# Patient Record
Sex: Female | Born: 1950 | Race: White | Hispanic: No | Marital: Married | Smoking: Former smoker
Health system: Southern US, Community
[De-identification: ages and names within clinical notes are randomized; demographics above are authoritative.]

## PROBLEM LIST (undated history)

## (undated) DIAGNOSIS — R002 Palpitations: Secondary | ICD-10-CM

## (undated) DIAGNOSIS — I456 Pre-excitation syndrome: Secondary | ICD-10-CM

## (undated) DIAGNOSIS — I6529 Occlusion and stenosis of unspecified carotid artery: Secondary | ICD-10-CM

## (undated) DIAGNOSIS — S62102A Fracture of unspecified carpal bone, left wrist, initial encounter for closed fracture: Secondary | ICD-10-CM

## (undated) DIAGNOSIS — I951 Orthostatic hypotension: Secondary | ICD-10-CM

## (undated) DIAGNOSIS — Z87898 Personal history of other specified conditions: Secondary | ICD-10-CM

## (undated) DIAGNOSIS — I6523 Occlusion and stenosis of bilateral carotid arteries: Secondary | ICD-10-CM

## (undated) DIAGNOSIS — R9431 Abnormal electrocardiogram [ECG] [EKG]: Secondary | ICD-10-CM

## (undated) DIAGNOSIS — R079 Chest pain, unspecified: Secondary | ICD-10-CM

## (undated) DIAGNOSIS — E7801 Familial hypercholesterolemia: Secondary | ICD-10-CM

## (undated) DIAGNOSIS — I10 Essential (primary) hypertension: Secondary | ICD-10-CM

## (undated) DIAGNOSIS — R001 Bradycardia, unspecified: Secondary | ICD-10-CM

## (undated) DIAGNOSIS — F419 Anxiety disorder, unspecified: Secondary | ICD-10-CM

## (undated) DIAGNOSIS — I471 Supraventricular tachycardia, unspecified: Secondary | ICD-10-CM

## (undated) DIAGNOSIS — T7840XA Allergy, unspecified, initial encounter: Secondary | ICD-10-CM

## (undated) HISTORY — PX: FRACTURE SURGERY: SHX138

## (undated) HISTORY — DX: Anxiety disorder, unspecified: F41.9

## (undated) HISTORY — DX: Abnormal electrocardiogram (ECG) (EKG): R94.31

## (undated) HISTORY — DX: Essential (primary) hypertension: I10

## (undated) HISTORY — PX: BREAST SURGERY: SHX581

## (undated) HISTORY — PX: MOLE REMOVAL: SHX2046

## (undated) HISTORY — PX: ABDOMINAL HYSTERECTOMY: SHX81

## (undated) HISTORY — DX: Pre-excitation syndrome: I45.6

## (undated) HISTORY — DX: Supraventricular tachycardia: I47.1

## (undated) HISTORY — DX: Palpitations: R00.2

## (undated) HISTORY — DX: Fracture of unspecified carpal bone, left wrist, initial encounter for closed fracture: S62.102A

## (undated) HISTORY — DX: Familial hypercholesterolemia: E78.01

## (undated) HISTORY — DX: Chest pain, unspecified: R07.9

## (undated) HISTORY — PX: BREAST BIOPSY: SHX20

## (undated) HISTORY — DX: Orthostatic hypotension: I95.1

## (undated) HISTORY — DX: Bradycardia, unspecified: R00.1

## (undated) HISTORY — PX: OTHER SURGICAL HISTORY: SHX169

## (undated) HISTORY — PX: BASAL CELL CARCINOMA EXCISION: SHX1214

## (undated) HISTORY — DX: Occlusion and stenosis of bilateral carotid arteries: I65.23

## (undated) HISTORY — DX: Personal history of other specified conditions: Z87.898

## (undated) HISTORY — DX: Allergy, unspecified, initial encounter: T78.40XA

## (undated) HISTORY — DX: Supraventricular tachycardia, unspecified: I47.10

---
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
## 1998-12-12 HISTORY — PX: ABLATION: SHX5711

## 2017-03-14 LAB — HM MAMMOGRAPHY

## 2017-12-29 ENCOUNTER — Encounter: Payer: Self-pay | Admitting: Family Medicine

## 2017-12-29 DIAGNOSIS — Z86006 Personal history of melanoma in-situ: Secondary | ICD-10-CM | POA: Insufficient documentation

## 2017-12-29 DIAGNOSIS — C439 Malignant melanoma of skin, unspecified: Secondary | ICD-10-CM | POA: Insufficient documentation

## 2017-12-29 DIAGNOSIS — E785 Hyperlipidemia, unspecified: Secondary | ICD-10-CM | POA: Insufficient documentation

## 2018-01-01 ENCOUNTER — Telehealth: Payer: Self-pay | Admitting: Family Medicine

## 2018-01-01 NOTE — Telephone Encounter (Signed)
Copied from [place]. Topic: Quick Communication - See Telephone Encounter >> Jan 01, 2018  2:28 PM Cleaster Corin, [place] wrote: CRM for notification. See Telephone encounter for:   01/01/18. Pt. Calling to see if records have been sent over to office. Pt. Can be reached at [phone] (new pt. Amber Nunez)

## 2018-01-02 NOTE — Telephone Encounter (Signed)
Patient calling again, please advise.

## 2018-01-02 NOTE — Telephone Encounter (Signed)
Spoke with patient, informed her that I would keep a look out for them and if they didn't come I would let her know.

## 2018-01-05 ENCOUNTER — Ambulatory Visit: Payer: BLUE CROSS/BLUE SHIELD | Admitting: Family Medicine

## 2018-01-05 ENCOUNTER — Encounter: Payer: Self-pay | Admitting: Family Medicine

## 2018-01-05 VITALS — BP 122/84 | HR 57 | Temp 97.9°F | Resp 12 | Ht 65.0 in | Wt 132.0 lb

## 2018-01-05 DIAGNOSIS — R001 Bradycardia, unspecified: Secondary | ICD-10-CM

## 2018-01-05 DIAGNOSIS — Z0000 Encounter for general adult medical examination without abnormal findings: Secondary | ICD-10-CM | POA: Diagnosis not present

## 2018-01-05 DIAGNOSIS — I456 Pre-excitation syndrome: Secondary | ICD-10-CM | POA: Insufficient documentation

## 2018-01-05 DIAGNOSIS — M792 Neuralgia and neuritis, unspecified: Secondary | ICD-10-CM

## 2018-01-05 DIAGNOSIS — E7800 Pure hypercholesterolemia, unspecified: Secondary | ICD-10-CM

## 2018-01-05 MED ORDER — PREDNISONE 20 MG PO TABS: ORAL_TABLET | ORAL | 0 refills | Status: AC

## 2018-01-05 NOTE — Progress Notes (Signed)
HPI:   Amber Nunez is a 67 y.o. female, who is here today to establish care.  Former PCP: N/A Last preventive routine visit: 02/2017 She recently moved from [place], 10/2017.  Chronic medical problems: HLD, WPW, anddermatitis among some.  She has follow with dermatologist in the past for skin lesions on face,upper extremities, and occasionally on abdomen. According to patient,Cclobetasol was recommended for pruritic rash on areas different of her face. Tacrolimus 0.03% was recommended for lesions on her face.  She is not sure about diagnosis, so far topical treatment has helped, she uses as needed.  Concerns today: Lab work and referral.  HLD: She has been on Pravastatin since 05/2017.  She did not tolerate another statin. Last lipid panel done in 02/2017. So far she has tolerated medication well, no side effects. She is also trying to follow a low fat diet.  WPW: Diagnosed in 2000, she has been following with cardiologist. Currently she is on Atenolol 25 mg daily. Today mild bradycardia noted, she is reporting heart rates at home in the upper 40s and mid 50s. She denies dizziness, palpitation, chest pain, or dyspnea.  She has not established with cardiologist yet.   She is also reporting history of cervical DDD, "collapsed disc." According to patient, she had a cervical MRI that about 2 years ago. About 3-4 weeks ago she started similar symptoms she did when she was first diagnosed: Burning sensation on elbow, shoulder, and left foot.  Associated with decreased sensation in same areas. She denies weakness or cervical pain. "Annoyed" burning sensation that sometimes wakes her up. Symptoms intermittently. She has not identified exacerbating or alleviating factors. She has not taking any OTC medication.  She did PT2 years ago, which help, she started exercises recently at home.   Review of Systems  Constitutional: Negative for activity change, appetite  change, fatigue and fever.  HENT: Negative for mouth sores, nosebleeds and trouble swallowing.   Eyes: Negative for redness and visual disturbance.  Respiratory: Negative for cough, shortness of breath and wheezing.   Cardiovascular: Negative for chest pain, palpitations and leg swelling.  Gastrointestinal: Negative for abdominal pain, nausea and vomiting.       Negative for changes in bowel habits.  Endocrine: Negative for cold intolerance and heat intolerance.  Genitourinary: Negative for decreased urine volume and hematuria.  Musculoskeletal: Negative for gait problem, myalgias and neck pain.  Skin: Negative for color change and rash.  Neurological: Negative for syncope, weakness and headaches.  Psychiatric/Behavioral: Positive for sleep disturbance. Negative for confusion. The patient is not nervous/anxious.       Current Outpatient Medications on File Prior to Visit  Medication Sig Dispense Refill  . aspirin 81 MG tablet Take 81 mg by mouth daily.    Marland Kitchen atenolol (TENORMIN) 25 MG tablet     . Biotin 1000 MCG tablet Take 1,000 mcg by mouth daily.    . Calcium Carb-Cholecalciferol (CALCIUM 500+D3 PO) Take by mouth.    . clobetasol cream (TEMOVATE) 0.05 %     . Multiple Vitamin (MULTIVITAMIN WITH MINERALS) TABS tablet Take 1 tablet by mouth daily.    . multivitamin-lutein (OCUVITE-LUTEIN) CAPS capsule Take 1 capsule by mouth daily.    . pravastatin (PRAVACHOL) 10 MG tablet     . tacrolimus (PROTOPIC) 0.03 % ointment Apply topically as needed.    . vitamin C (ASCORBIC ACID) 500 MG tablet Take 500 mg by mouth daily.     No current facility-administered  medications on file prior to visit.      History reviewed. No pertinent past medical history. Allergies  Allergen Reactions  . Penicillins Nausea And Vomiting  . Lortab [Hydrocodone-Acetaminophen]     Family History  Problem Relation Age of Onset  . Heart disease Mother   . Heart disease Father   . Hyperlipidemia Father   .  Heart attack Father   . Early death Father   . Cancer Father   . Hyperlipidemia Brother     Social History   Socioeconomic History  . Marital status: Unknown    Spouse name: None  . Number of children: None  . Years of education: None  . Highest education level: None  Social Needs  . Financial resource strain: None  . Food insecurity - worry: None  . Food insecurity - inability: None  . Transportation needs - medical: None  . Transportation needs - non-medical: None  Occupational History  . None  Tobacco Use  . Smoking status: Former Smoker    Types: Cigarettes  . Smokeless tobacco: Never Used  Substance and Sexual Activity  . Alcohol use: Yes  . Drug use: No  . Sexual activity: No    Partners: Male  Other Topics Concern  . None  Social History Narrative  . None    Vitals:   01/05/18 0803  BP: 122/84  Pulse: (!) 57  Resp: 12  Temp: 97.9 F (36.6 C)  SpO2: 100%    Body mass index is 21.97 kg/m.   Physical Exam  Nursing note and vitals reviewed. Constitutional: She is oriented to person, place, and time. She appears well-developed and well-nourished. No distress.  HENT:  Head: Normocephalic and atraumatic.  Mouth/Throat: Oropharynx is clear and moist and mucous membranes are normal.  Eyes: Conjunctivae are normal. Pupils are equal, round, and reactive to light.  Neck: No tracheal deviation present. No thyroid mass and no thyromegaly present.  Cardiovascular: Regular rhythm. Bradycardia present.  No murmur heard. Pulses:      Dorsalis pedis pulses are 2+ on the right side, and 2+ on the left side.  Respiratory: Effort normal and breath sounds normal. No respiratory distress.  GI: Soft. She exhibits no mass. There is no hepatomegaly. There is no tenderness.  Musculoskeletal: She exhibits no edema or tenderness.       Cervical back: She exhibits decreased range of motion (Flexion and rotation,mild). She exhibits no tenderness and no bony tenderness.    Lymphadenopathy:    She has no cervical adenopathy.  Neurological: She is alert and oriented to person, place, and time. She has normal strength. Gait normal.  Skin: Skin is warm. No rash noted. No erythema.  Psychiatric: She has a normal mood and affect.  Well groomed, good eye contact.     ASSESSMENT AND PLAN:   Amber Nunez was seen today for establish care.  Diagnoses and all orders for this visit:  Hypercholesterolemia  No changes in Pravastatin 10 mg daily. Continue low-fat diet. She will be back for fasting labs next week. Follow-up in 6-12 months.  -     Comprehensive metabolic panel; Future -     Lipid panel; Future  Radicular pain in left arm  After discussion of some side effects of Prednisone, she agrees with taking a short course of medication. For now I do not seen we need cervical imaging but we will need to consider MRI and/or referral to orthopedist if symptoms do not resolve in 4-6 weeks or if  symptoms get worse. Instructed about warning signs.  -     predniSONE (DELTASONE) 20 MG tablet; 3 tabs for 3 days, 2 tabs for 3 days, 1 tabs for 3 days, and 1/2 tab for 3 days. Take tables together with breakfast.  WPW (Wolff-Parkinson-White syndrome)  Asymptomatic. Cardiology referral placed.  -     Ambulatory referral to Cardiology  Sinus bradycardia  We discussed some side effects of Atenolol, she agrees with decreasing dose from 25 mg to 12.5 mg. Continue monitoring HR regularly. Instructed about warning signs.    Healthcare maintenance  Refused flu shot. Per patient report,she has done the following: Pneumo x 2 (Prevnar and Pneumovax). Zoster vaccine Colonoscopy 3-4 years ago,10 years f/u recommended      Anothy Bufano G. Martinique, MD  [facility]. [place] office.

## 2018-01-05 NOTE — Patient Instructions (Addendum)
A few things to remember from today's visit:   Hypercholesterolemia - Plan: Comprehensive metabolic panel, Lipid panel  WPW (Wolff-Parkinson-White syndrome) - Plan: Ambulatory referral to Cardiology  Radicular pain in left arm  Please let me know if you are not better in 4 weeks,or if symptoms are worse. Labs next week.   Please be sure medication list is accurate. If a new problem present, please set up appointment sooner than planned today.

## 2018-01-09 ENCOUNTER — Other Ambulatory Visit (INDEPENDENT_AMBULATORY_CARE_PROVIDER_SITE_OTHER): Payer: BLUE CROSS/BLUE SHIELD

## 2018-01-09 DIAGNOSIS — E7800 Pure hypercholesterolemia, unspecified: Secondary | ICD-10-CM

## 2018-01-09 LAB — COMPREHENSIVE METABOLIC PANEL
ALK PHOS: 65 U/L (ref 39–117)
ALT: 16 U/L (ref 0–35)
AST: 22 U/L (ref 0–37)
Albumin: 4.4 g/dL (ref 3.5–5.2)
BILIRUBIN TOTAL: 0.8 mg/dL (ref 0.2–1.2)
BUN: 19 mg/dL (ref 6–23)
CALCIUM: 9.4 mg/dL (ref 8.4–10.5)
CO2: 31 meq/L (ref 19–32)
CREATININE: 0.7 mg/dL (ref 0.40–1.20)
Chloride: 102 mEq/L (ref 96–112)
GFR: 88.91 mL/min (ref >60.00)
GLUCOSE: 91 mg/dL (ref 70–99)
Potassium: 4.1 mEq/L (ref 3.5–5.1)
Sodium: 141 mEq/L (ref 135–145)
TOTAL PROTEIN: 7.3 g/dL (ref 6.0–8.3)

## 2018-01-09 LAB — LIPID PANEL
CHOLESTEROL: 190 mg/dL (ref 0–200)
HDL: 70.2 mg/dL (ref >39.00)
LDL Cholesterol: 103 mg/dL — ABNORMAL HIGH (ref 0–99)
NonHDL: 119.45
TRIGLYCERIDES: 84 mg/dL (ref 0.0–149.0)
Total CHOL/HDL Ratio: 3
VLDL: 16.8 mg/dL (ref 0.0–40.0)

## 2018-01-10 NOTE — Telephone Encounter (Signed)
Pt need to sign a release °

## 2018-01-10 NOTE — Telephone Encounter (Signed)
Spoke with patient, patient stated that she would come in on her way to work to sign a release so that we could get records.

## 2018-01-11 ENCOUNTER — Encounter: Payer: Self-pay | Admitting: Family Medicine

## 2018-01-15 ENCOUNTER — Encounter: Payer: Self-pay | Admitting: Family Medicine

## 2018-01-26 ENCOUNTER — Encounter: Payer: Self-pay | Admitting: Family Medicine

## 2018-01-30 ENCOUNTER — Other Ambulatory Visit: Payer: Self-pay | Admitting: Family Medicine

## 2018-01-30 DIAGNOSIS — C439 Malignant melanoma of skin, unspecified: Secondary | ICD-10-CM

## 2018-01-30 DIAGNOSIS — M542 Cervicalgia: Secondary | ICD-10-CM

## 2018-02-01 ENCOUNTER — Encounter: Payer: Self-pay | Admitting: Family Medicine

## 2018-02-05 ENCOUNTER — Other Ambulatory Visit: Payer: Self-pay | Admitting: Family Medicine

## 2018-02-05 DIAGNOSIS — M542 Cervicalgia: Secondary | ICD-10-CM

## 2018-02-06 ENCOUNTER — Encounter: Payer: Self-pay | Admitting: Family Medicine

## 2018-02-12 ENCOUNTER — Ambulatory Visit: Payer: BLUE CROSS/BLUE SHIELD | Attending: Family Medicine | Admitting: Physical Therapy

## 2018-02-12 DIAGNOSIS — R252 Cramp and spasm: Secondary | ICD-10-CM | POA: Diagnosis present

## 2018-02-12 DIAGNOSIS — M542 Cervicalgia: Secondary | ICD-10-CM | POA: Diagnosis not present

## 2018-02-12 DIAGNOSIS — M6281 Muscle weakness (generalized): Secondary | ICD-10-CM | POA: Diagnosis present

## 2018-02-12 NOTE — Therapy (Signed)
[facility] [address] [place], [place], [address] Phone: [phone]   Fax:  [phone]  Physical Therapy Evaluation  Patient Details  Name: Amber Nunez MRN: [id number] Date of Birth: 05/09/1951 Referring Provider: Martinique, Betty G, MD   Encounter Date: 02/12/2018  PT End of Session - 02/12/18 1708    Visit Number  1    Number of Visits  8    Date for PT Re-Evaluation  04/09/18    Authorization Type  [employer]    PT Start Time  1530    PT Stop Time  1604    PT Time Calculation (min)  34 min    Activity Tolerance  Patient tolerated treatment well    Behavior During Therapy  [facility] for tasks assessed/performed       Past Medical History:  Diagnosis Date  . Abnormal electrocardiogram (ECG) (EKG)   . Bilateral carotid artery stenosis   . Chest pain   . Essential hypertension, benign   . Familial hypercholesterolemia   . Hx of dizziness   . Hx of syncope   . Palpitations   . Sinus bradycardia   . SVT (supraventricular tachycardia) ([place])   . Wolff-Parkinson-White (WPW) syndrome     Past Surgical History:  Procedure Laterality Date  . ABLATION  2000   had 2 for WPW  . BREAST SURGERY     BIOPSY, 3 YEARS AGO  . MOLE REMOVAL    . OVARIES REMOVED      There were no vitals filed for this visit.   Subjective Assessment - 02/12/18 1534    Subjective  Pt reports the elbow into the shoulder feels like burning and tingling and usually occurs in the morning after sleeping on my side.       Limitations  Other (comment) sleeping positions    Patient Stated Goals  feel stronger and not have burning in elbow in the morning    Currently in Pain?  Yes    Pain Score  0-No pain worst 4-5/10    Pain Location  Elbow    Pain Orientation  Left    Pain Descriptors / Indicators  Burning;Tingling    Pain Type  Chronic pain;Acute pain    Pain Radiating Towards  up to shoulder    Pain Onset  More than a month ago     Pain Frequency  Intermittent    Aggravating Factors   sleeping on it    Pain Relieving Factors  moving around in the morning, goes away in the morning    Effect of Pain on Daily Activities  no    Multiple Pain Sites  No         [facility] PT Assessment - 02/12/18 0001      Assessment   Medical Diagnosis  M54.2 (ICD-10-CM) - Cervicalgia    Referring Provider  Martinique, Betty G, MD    Prior Therapy  Yes about one year ago for same issue      Precautions   Precautions  None      Restrictions   Weight Bearing Restrictions  No      Balance Screen   Has the patient fallen in the past 6 months  No      [place] residence    Living Arrangements  Spouse/significant other      Prior Function   Level of Independence  Independent      Cognition  Overall Cognitive Status  Within Functional Limits for tasks assessed      Observation/Other Assessments   Focus on Therapeutic Outcomes (FOTO)   13% limited      Posture/Postural Control   Posture/Postural Control  Postural limitations    Postural Limitations  Rounded Shoulders;Forward head      ROM / Strength   AROM / PROM / Strength  Strength;PROM;AROM      AROM   Overall AROM Comments  WFL bilat shoulder flex and abduction; cervical rotation and SB      PROM   Overall PROM Comments  cervical SB and rotation      Strength   Overall Strength Comments  Lt shoulder flexion and ER 4/5      Palpation   Palpation comment  tight and tender to palpation: suboccipitals bilateral, cervical paraspinals bilateral; right scalenes, left upper trap, pecs, tricep      Special Tests    Special Tests  Cervical    Cervical Tests  Dictraction;Spurling's      Spurling's   Findings  Negative      Distraction Test   Findngs  Negative      Ambulation/Gait   Gait Pattern  Within Functional Limits             Objective measurements completed on examination: See above findings.               PT Education - 02/12/18 1710    Education provided  Yes    Education Details  reviewed POC and reviewed neural glides median nerver and bilateral ER (pt has been doing and demonstrates correctly)    Person(s) Educated  Patient    Methods  Explanation    Comprehension  Verbalized understanding       PT Short Term Goals - 02/12/18 1720      PT SHORT TERM GOAL #1   Title  ind with initial HEP    Time  4    Period  Weeks    Status  New    Target Date  03/12/18      PT SHORT TERM GOAL #2   Title  MMT shoulder flexion and extension 5/5 bilaterally due to improved postural stability    Period  Weeks    Status  New    Target Date  03/12/18        PT Long Term Goals - 02/12/18 1721      PT LONG TERM GOAL #1   Title  ind with advanced HEP    Time  8    Period  Weeks    Status  New    Target Date  04/09/18      PT LONG TERM GOAL #2   Title  pt reports waking up in the morning with no burning or radicular symptoms in elbow or shoulder    Time  8    Period  Weeks    Status  New    Target Date  04/09/18      PT LONG TERM GOAL #3   Title  Pt has no palpable trigger points in cervical muscles in order to reduce nerve compression and aggravation of radicular symptoms.    Time  8    Period  Weeks    Status  New    Target Date  04/09/18      PT LONG TERM GOAL #4   Period  --    Status  --    Target Date  --  Plan - 02/12/18 1712    Clinical Impression Statement  Pt presents to clinic with elbow pain and burning that feels like it radiates up to shoulder.  Pt has tenderness and reproduction of symptoms with palpation of medial epicondyle proximally.  she has tight and tender suboccipitals and trigger points along cervical paraspinals with largest one being left side of C3-4.  Pt has weakness of UE left shoulder flexion and external rotation.  Pt has abdormalities of posture as mentioned above.  She is negative for cervical compression  and distraction.  She will benefit from skilled PT to address impairments and return to maximum function.    History and Personal Factors relevant to plan of care:  acute onset of chronic pain    Clinical Presentation  Stable    Clinical Presentation due to:  pt is stable    Clinical Decision Making  Low    Rehab Potential  Excellent    PT Frequency  1x / week    PT Duration  8 weeks    PT Treatment/Interventions  Biofeedback;Electrical Stimulation;Cryotherapy;Moist Heat;Traction;Therapeutic activities;Therapeutic exercise;Functional mobility training;Neuromuscular re-education;Patient/family education;Manual techniques;Passive range of motion;Dry needling;Taping    PT Next Visit Plan  STM to cervical paraspinals, suboccipitals, scalenes, triceps, postural strengthening    Consulted and Agree with Plan of Care  Patient       Patient will benefit from skilled therapeutic intervention in order to improve the following deficits and impairments:  Pain, Increased fascial restricitons, Increased muscle spasms, Decreased strength  Visit Diagnosis: Cervicalgia  Cramp and spasm  Muscle weakness (generalized)     Problem List Patient Active Problem List   Diagnosis Date Noted  . Cervicalgia 01/30/2018  . WPW (Wolff-Parkinson-White syndrome) 01/05/2018  . Hypercholesterolemia 12/29/2017  . Melanoma of skin ([place]) 12/29/2017    Zannie Cove, PT 02/12/2018, 5:37 PM  [facility] [address], [place], [place], [address] Phone: [phone]   Fax:  [phone]  Name: Amber Nunez MRN: [id number] Date of Birth: 10-21-1951

## 2018-02-15 ENCOUNTER — Ambulatory Visit: Payer: BLUE CROSS/BLUE SHIELD | Admitting: Internal Medicine

## 2018-02-15 ENCOUNTER — Encounter: Payer: Self-pay | Admitting: Internal Medicine

## 2018-02-15 VITALS — BP 128/68 | HR 63 | Ht 65.0 in | Wt 128.8 lb

## 2018-02-15 DIAGNOSIS — E785 Hyperlipidemia, unspecified: Secondary | ICD-10-CM

## 2018-02-15 DIAGNOSIS — R001 Bradycardia, unspecified: Secondary | ICD-10-CM | POA: Diagnosis not present

## 2018-02-15 DIAGNOSIS — Z8679 Personal history of other diseases of the circulatory system: Secondary | ICD-10-CM | POA: Diagnosis not present

## 2018-02-15 NOTE — Patient Instructions (Addendum)
Medication Instructions:  Your physician recommends that you continue on your current medications as directed. Please refer to the Current Medication list given to you today.  -- If you need a refill on your cardiac medications before your next appointment, please call your pharmacy. --  Labwork: None ordered  Testing/Procedures: Your physician has recommended that you wear a 48 hour holter monitor. Holter monitors are medical devices that record the heart's electrical activity. Doctors most often use these monitors to diagnose arrhythmias. Arrhythmias are problems with the speed or rhythm of the heartbeat. The monitor is a small, portable device. You can wear one while you do your normal daily activities. This is usually used to diagnose what is causing palpitations/syncope (passing out).   Follow-Up: Your physician wants you to follow-up in: 6 months with Dr. Saunders Revel.    You will receive a reminder letter in the mail two months in advance. If you don't receive a letter, please call our office to schedule the follow-up appointment.  Thank you for choosing [facility]!!    Any Other Special Instructions Will Be Listed Below (If Applicable).

## 2018-02-15 NOTE — Progress Notes (Signed)
New Outpatient Visit Date: 02/15/2018  Referring Provider: Martinique, Betty G, MD [place], [place] [address]  Chief Complaint: Low heart rate  HPI:  Amber Nunez is a 67 y.o. female who is being seen today for the evaluation of Wolff-Parkinson-White syndrome at the request of Dr. Martinique. She has a history of Wolff-Parkinson-White syndrome first diagnosed around 2000. She is status post accessory pathway ablation 2 (2013 and 2014). She also has a history of hyperlipidemia, hypertension, carotid artery stenosis, and atypical chest pain with negative Cardiolite last year. She previously lived in [place] and was followed at [facility] in [place], [place].   Today, the patient reports feeling well. She has noted her heart rate dipping down into the 40s on her Fitbit. Her rate was also slightly low when she was seen by Dr. Martinique in late January. At that time, Ms. Melrose was advised to decrease atenolol to 12.5 mg daily. She did this for a few weeks but noticed that her heart rate is low at home. She therefore went back to 25 mg daily. She did not have any symptoms while taking the lower dose of atenolol. She denies palpitations, chest pain, shortness of breath, lightheadedness, orthopnea, PND, and edema. Her only other concern is of an intermittent "vibrating" sensation in her chest. This has not happened for several months and typically only lasts about 10 seconds. It is different than the palpitations that she experienced in the past with her SVT in the setting of WPW. She typically consumes one caffeinated cup of coffee per day.  Ms. Folz has noted a rash along her sternum, that she describes as hives. She is awaiting dermatology referral from Dr. Martinique. This began about 2 months ago.  --------------------------------------------------------------------------------------------------  Cardiovascular History & Procedures: Cardiovascular  Problems:  Wolff-Parkinson-White syndrome  Risk Factors:  Age greater than 65  Cath/PCI:  None available  CV Surgery:  None   EP Procedures and Devices:  EP study and accessory pathway ablation (2013 and 2014)  Non-Invasive Evaluation(s):  Exercise MPI (01/10/17): Normal myocardial perfusion without ischemia or scar. LVEF 80%.  Carotid artery Doppler (01/10/17): Mild bilateral internal and external carotid artery plaquing (less than 50% stenosis). Antegrade vertebral artery flow bilaterally.  Recent CV Pertinent Labs: Lab Results  Component Value Date   CHOL 190 01/09/2018   HDL 70.20 01/09/2018   LDLCALC 103 (H) 01/09/2018   TRIG 84.0 01/09/2018   CHOLHDL 3 01/09/2018   K 4.1 01/09/2018   BUN 19 01/09/2018   CREATININE 0.70 01/09/2018    --------------------------------------------------------------------------------------------------  Past Medical History:  Diagnosis Date  . Abnormal electrocardiogram (ECG) (EKG)   . Bilateral carotid artery stenosis   . Chest pain   . Essential hypertension, benign   . Familial hypercholesterolemia   . Hx of dizziness   . Hx of syncope   . Palpitations   . Sinus bradycardia   . SVT (supraventricular tachycardia) ([place])   . Wolff-Parkinson-White (WPW) syndrome     Past Surgical History:  Procedure Laterality Date  . ABLATION  2000   had 2 for WPW  . BREAST SURGERY     BIOPSY, 3 YEARS AGO  . MOLE REMOVAL    . OVARIES REMOVED      Current Meds  Medication Sig  . aspirin 81 MG tablet Take 81 mg by mouth daily.  Marland Kitchen atenolol (TENORMIN) 25 MG tablet   . clobetasol cream (TEMOVATE) 0.05 %   . Multiple Vitamin (MULTIVITAMIN WITH MINERALS)  TABS tablet Take 1 tablet by mouth daily.  . multivitamin-lutein (OCUVITE-LUTEIN) CAPS capsule Take 1 capsule by mouth daily.  . pravastatin (PRAVACHOL) 10 MG tablet   . tacrolimus (PROTOPIC) 0.03 % ointment Apply topically as needed.  . vitamin C (ASCORBIC ACID) 500 MG tablet Take  500 mg by mouth daily.    Allergies: Penicillins; Atorvastatin; and Lortab [hydrocodone-acetaminophen]  Social History   Socioeconomic History  . Marital status: Married    Spouse name: Not on file  . Number of children: Not on file  . Years of education: Not on file  . Highest education level: Not on file  Social Needs  . Financial resource strain: Not on file  . Food insecurity - worry: Not on file  . Food insecurity - inability: Not on file  . Transportation needs - medical: Not on file  . Transportation needs - non-medical: Not on file  Occupational History  . Not on file  Tobacco Use  . Smoking status: Former Smoker    Packs/day: 0.50    Years: 10.00    Pack years: 5.00    Types: Cigarettes    Last attempt to quit: 1990    Years since quitting: 29.1  . Smokeless tobacco: Never Used  Substance and Sexual Activity  . Alcohol use: Yes    Comment: One drink per month  . Drug use: No  . Sexual activity: No    Partners: Male    Comment: MARRIEDE  Other Topics Concern  . Not on file  Social History Narrative  . Not on file    Family History  Problem Relation Age of Onset  . Heart disease Mother        CAD  . Heart disease Father   . Hyperlipidemia Father   . Heart attack Father 44       CABG X 3  . Early death Father   . Cancer Father   . Hyperlipidemia Brother     Review of Systems: A 12-system review of systems was performed and was negative except as noted in the HPI.  --------------------------------------------------------------------------------------------------  Physical Exam: BP 128/68 (BP Location: Left Arm, Patient Position: Sitting, Cuff Size: Normal)   Pulse 63   Ht 5\' 5"  (1.651 m)   Wt 128 lb 12.8 oz (58.4 kg)   SpO2 97%   BMI 21.43 kg/m   General:  Slender woman, seated comfortably on the exam room. HEENT: No conjunctival pallor or scleral icterus. Moist mucous membranes. OP clear. Healing cold sore noted on the lower lip Neck:  Supple without lymphadenopathy, thyromegaly, JVD, or HJR. No carotid bruit. Lungs: Normal work of breathing. Clear to auscultation bilaterally without wheezes or crackles. Heart: Regular rate and rhythm without murmurs, rubs, or gallops. Non-displaced PMI. Abd: Bowel sounds present. Soft, NT/ND without hepatosplenomegaly Ext: No lower extremity edema. Radial, PT, and DP pulses are 2+ bilaterally Skin: Warm and dry. Neuro: CNIII-XII intact. Strength and fine-touch sensation intact in upper and lower extremities bilaterally. Psych: Normal mood and affect.  EKG:  Normal sinus rhythm without abnormalities.  No results found for: WBC, HGB, HCT, MCV, PLT  Lab Results  Component Value Date   NA 141 01/09/2018   K 4.1 01/09/2018   CL 102 01/09/2018   CO2 31 01/09/2018   BUN 19 01/09/2018   CREATININE 0.70 01/09/2018   GLUCOSE 91 01/09/2018   ALT 16 01/09/2018    Lab Results  Component Value Date   CHOL 190 01/09/2018   HDL 70.20  01/09/2018   LDLCALC 103 (H) 01/09/2018   TRIG 84.0 01/09/2018   CHOLHDL 3 01/09/2018    --------------------------------------------------------------------------------------------------  ASSESSMENT AND PLAN: Bradycardia Sinus bradycardia noted had recent visit with Dr. Martinique. Ms. Mangen notes readings on her Fitbit have been as low as 40 bpm at home. She has been asymptomatic. We have agreed to obtain a 48-hour Holter monitor to better characterize her bradycardia. Heart rate today is low normal at 63 bpm. We have agreed to continue her current dose of atenolol pending further assessment.  History of WPW No palpitations to suggest recurrent SVT. Continue atenolol.  Hyperlipidemia Patient is tolerating pravastatin well. She has history of mild to moderate carotid artery stenosis; escalation of statin therapy could be considered in the future to achieve an LDL less than 100, ideally below 70.  Follow-up: Return to clinic in 6 months.  Nelva Bush, MD 02/15/2018 5:37 PM

## 2018-02-19 ENCOUNTER — Encounter: Payer: Self-pay | Admitting: Physical Therapy

## 2018-02-19 ENCOUNTER — Ambulatory Visit: Payer: BLUE CROSS/BLUE SHIELD | Admitting: Physical Therapy

## 2018-02-19 ENCOUNTER — Encounter: Payer: Self-pay | Admitting: Family Medicine

## 2018-02-19 DIAGNOSIS — M542 Cervicalgia: Secondary | ICD-10-CM | POA: Diagnosis not present

## 2018-02-19 DIAGNOSIS — R252 Cramp and spasm: Secondary | ICD-10-CM

## 2018-02-19 DIAGNOSIS — M6281 Muscle weakness (generalized): Secondary | ICD-10-CM

## 2018-02-19 NOTE — Therapy (Signed)
Amber Nunez [facility] [address] [place], [place], [address] Phone: [phone]   Fax:  [phone]  Physical Therapy Treatment  Patient Details  Name: Amber Nunez MRN: [id number] Date of Birth: Feb 20, 1951 Referring Provider: Martinique, Betty G, MD   Encounter Date: 02/19/2018  PT End of Session - 02/19/18 1537    Visit Number  2    Number of Visits  8    Date for PT Re-Evaluation  04/09/18    Authorization Type  BCBS    PT Start Time  1530    PT Stop Time  1615    PT Time Calculation (min)  45 min    Activity Tolerance  Patient tolerated treatment well    Behavior During Therapy  --       Past Medical History:  Diagnosis Date  . Abnormal electrocardiogram (ECG) (EKG)   . Bilateral carotid artery stenosis   . Chest pain   . Essential hypertension, benign   . Familial hypercholesterolemia   . Hx of dizziness   . Hx of syncope   . Palpitations   . Sinus bradycardia   . SVT (supraventricular tachycardia) ([place])   . Wolff-Parkinson-White (WPW) syndrome     Past Surgical History:  Procedure Laterality Date  . ABLATION  2000   had 2 for WPW  . BREAST SURGERY     BIOPSY, 3 YEARS AGO  . MOLE REMOVAL    . OVARIES REMOVED      There were no vitals filed for this visit.  Subjective Assessment - 02/19/18 1537    Subjective  Still has burning: AM it is mainly LT but can change to the RT. It is typically inside of the elbow.     Patient Stated Goals  feel stronger and not have burning in elbow in the morning    Currently in Pain?  No/denies    Multiple Pain Sites  No                      [facility] Adult PT Treatment/Exercise - 02/19/18 0001      Traction   Type of Traction  Cervical    Min (lbs)  5    Max (lbs)  15    Hold Time  60    Rest Time  20    Time  10      Manual Therapy   Manual Therapy  Soft tissue mobilization    Soft tissue mobilization  Occipitals, scalenes, post cervical  paraspinals LT tighter than RT today gentle intermittent traction               PT Short Term Goals - 02/12/18 1720      PT SHORT TERM GOAL #1   Title  ind with initial HEP    Time  4    Period  Weeks    Status  New    Target Date  03/12/18      PT SHORT TERM GOAL #2   Title  MMT shoulder flexion and extension 5/5 bilaterally due to improved postural stability    Period  Weeks    Status  New    Target Date  03/12/18        PT Long Term Goals - 02/12/18 1721      PT LONG TERM GOAL #1   Title  ind with advanced HEP    Time  8    Period  Weeks  Status  New    Target Date  04/09/18      PT LONG TERM GOAL #2   Title  pt reports waking up in the morning with no burning or radicular symptoms in elbow or shoulder    Time  8    Period  Weeks    Status  New    Target Date  04/09/18      PT LONG TERM GOAL #3   Title  Pt has no palpable trigger points in cervical muscles in order to reduce nerve compression and aggravation of radicular symptoms.    Time  8    Period  Weeks    Status  New    Target Date  04/09/18      PT LONG TERM GOAL #4   Period  --    Status  --    Target Date  --            Plan - 02/19/18 1537    Clinical Impression Statement  Pt continues with medial elbow burning which is worse in the AM. LT cervical muscles were slightly tighter versus on the RT today. At on epoint pt complained of tingling down into her LT thumb during soft tissue work. We ended the session with a trail of mechanical cervical traction.      Rehab Potential  Excellent    PT Frequency  1x / week    PT Duration  8 weeks    PT Treatment/Interventions  Biofeedback;Electrical Stimulation;Cryotherapy;Moist Heat;Traction;Therapeutic activities;Therapeutic exercise;Functional mobility training;Neuromuscular re-education;Patient/family education;Manual techniques;Passive range of motion;Dry needling;Taping    PT Next Visit Plan  Strengthening to shoulder ER and flexors, see  what traction did for pt, traction if pt benefitted.    Consulted and Agree with Plan of Care  Patient       Patient will benefit from skilled therapeutic intervention in order to improve the following deficits and impairments:  Pain, Increased fascial restricitons, Increased muscle spasms, Decreased strength  Visit Diagnosis: Cervicalgia  Cramp and spasm  Muscle weakness (generalized)     Problem List Patient Active Problem List   Diagnosis Date Noted  . Bradycardia 02/15/2018  . Cervicalgia 01/30/2018  . WPW (Wolff-Parkinson-White syndrome) 01/05/2018  . Hyperlipidemia 12/29/2017  . Melanoma of skin ([place]) 12/29/2017    [name], PTA 02/19/2018, 4:02 PM  [facility] [address] [place], [place], [address] Phone: [phone]   Fax:  [phone]  Name: Amber Nunez MRN: [id number] Date of Birth: February 08, 1951

## 2018-02-27 ENCOUNTER — Ambulatory Visit: Payer: BLUE CROSS/BLUE SHIELD | Admitting: Physical Therapy

## 2018-02-27 ENCOUNTER — Encounter: Payer: Self-pay | Admitting: Physical Therapy

## 2018-02-27 DIAGNOSIS — M542 Cervicalgia: Secondary | ICD-10-CM

## 2018-02-27 DIAGNOSIS — M6281 Muscle weakness (generalized): Secondary | ICD-10-CM

## 2018-02-27 DIAGNOSIS — R252 Cramp and spasm: Secondary | ICD-10-CM

## 2018-02-27 NOTE — Therapy (Signed)
[facility] [address], [place], [place], [address] Phone: [phone]   Fax:  [phone]  Physical Therapy Treatment  Patient Details  Name: Amber RIEGER MRN: [id number] Date of Birth: [birth year] Referring Provider: Martinique, Betty G, MD   Encounter Date: 02/27/2018  PT End of Session - 02/27/18 1533    Visit Number  3    Number of Visits  8    Date for PT Re-Evaluation  04/09/18    Authorization Type  BCBS    PT Start Time  1533    PT Stop Time  1612    PT Time Calculation (min)  39 min    Activity Tolerance  Patient tolerated treatment well    Behavior During Therapy  [facility] for tasks assessed/performed       Past Medical History:  Diagnosis Date  . Abnormal electrocardiogram (ECG) (EKG)   . Bilateral carotid artery stenosis   . Chest pain   . Essential hypertension, benign   . Familial hypercholesterolemia   . Hx of dizziness   . Hx of syncope   . Palpitations   . Sinus bradycardia   . SVT (supraventricular tachycardia) ([place])   . Wolff-Parkinson-White (WPW) syndrome     Past Surgical History:  Procedure Laterality Date  . ABLATION  2000   had 2 for WPW  . BREAST SURGERY     BIOPSY, 3 YEARS AGO  . MOLE REMOVAL    . OVARIES REMOVED      There were no vitals filed for this visit.  Subjective Assessment - 02/27/18 1604    Subjective  States she was sore after the traction.  States she is not feeling pain but is still sore from the traction and into her left shoudler.    Currently in Pain?  No/denies                      [facility] Adult PT Treatment/Exercise - 02/27/18 0001      Self-Care   Self-Care  Posture    Posture  educated and used Geologist, engineering for visual cues      Neck Exercises: Seated   Other Seated Exercise  scap squeezes, upper trap and scalene stretch      Manual Therapy   Soft tissue mobilization  Occipitals, scalenes, post cervical paraspinals LT tighter than RT  today gentle intermittent traction and MFR             PT Education - 02/27/18 1700    Education provided  Yes    Education Details  upper trap and scalene stretch, posture and sitting with feet supported    Person(s) Educated  Patient    Methods  Explanation;Demonstration;Handout;Tactile cues;Verbal cues    Comprehension  Verbalized understanding;Returned demonstration       PT Short Term Goals - 02/12/18 1720      PT SHORT TERM GOAL #1   Title  ind with initial HEP    Time  4    Period  Weeks    Status  New    Target Date  03/12/18      PT SHORT TERM GOAL #2   Title  MMT shoulder flexion and extension 5/5 bilaterally due to improved postural stability    Period  Weeks    Status  New    Target Date  03/12/18        PT Long Term Goals - 02/12/18 1721      PT  LONG TERM GOAL #1   Title  ind with advanced HEP    Time  8    Period  Weeks    Status  New    Target Date  04/09/18      PT LONG TERM GOAL #2   Title  pt reports waking up in the morning with no burning or radicular symptoms in elbow or shoulder    Time  8    Period  Weeks    Status  New    Target Date  04/09/18      PT LONG TERM GOAL #3   Title  Pt has no palpable trigger points in cervical muscles in order to reduce nerve compression and aggravation of radicular symptoms.    Time  8    Period  Weeks    Status  New    Target Date  04/09/18      PT LONG TERM GOAL #4   Period  --    Status  --    Target Date  --            Plan - 02/27/18 1702    Clinical Impression Statement  Patient did not have elbow burning today.  She defers cervical traction due to reporting bad experience at previous visit.  Pt tolerated manual therapy and reports a little relief after treatment today.  Pt demonstrated improved posture with verbal and tactile cues.  Pt demonstrates excess kyphosis and will benefit from skilled PT to work on posture and cervical strength and ROM.     Rehab Potential  Excellent    PT  Treatment/Interventions  Biofeedback;Electrical Stimulation;Cryotherapy;Moist Heat;Traction;Therapeutic activities;Therapeutic exercise;Functional mobility training;Neuromuscular re-education;Patient/family education;Manual techniques;Passive range of motion;Dry needling;Taping    PT Next Visit Plan  thoracic extension, review and strengthen posture as needed, manual therapy as needed if benefitted patient    Recommended Other Services  orders signed    Consulted and Agree with Plan of Care  Patient       Patient will benefit from skilled therapeutic intervention in order to improve the following deficits and impairments:  Pain, Increased fascial restricitons, Increased muscle spasms, Decreased strength  Visit Diagnosis: Cervicalgia  Cramp and spasm  Muscle weakness (generalized)     Problem List Patient Active Problem List   Diagnosis Date Noted  . Bradycardia 02/15/2018  . Cervicalgia 01/30/2018  . WPW (Wolff-Parkinson-White syndrome) 01/05/2018  . Hyperlipidemia 12/29/2017  . Melanoma of skin ([place]) 12/29/2017    Zannie Cove, PT 02/27/2018, 5:22 PM  [facility] [address] [place], [place], [address] Phone: [phone]   Fax:  [phone]  Name: Amber Nunez MRN: [id number] Date of Birth: 11-03-1951

## 2018-02-27 NOTE — Patient Instructions (Signed)
Ear / Shoulder Stretch    Exhaling, move left ear toward left shoulder. Hold position for __2_ breaths. Inhaling, bring head back to center. Repeat to other side. Repeat sequence _5__ times. Do _2__ times per day.   Do with head slightly forward and slightly back for variation of stretch Copyright  VHI. All rights reserved.     Posture - Standing   Good posture is important. Avoid slouching and forward head thrust. Maintain curve in low back and align ears over shoulders, hips over ankles.  Pull your belly button in toward your back bone. Posture Tips DO: - stand tall and erect - keep chin tucked in - keep head and shoulders in alignment - check posture regularly in mirror or large window - pull head back against headrest in car seat;  Change your position often.  Sit with lumbar support. DON'T: - slouch or slump while watching TV or reading - sit, stand or lie in one position  for too long;  Sitting is especially hard on the spine so if you sit at a desk/use the computer, then stand up often! Copyright  VHI. All rights reserved.  Posture - Sitting  Sit upright, head facing forward. Try using a roll to support lower back. Keep shoulders relaxed, and avoid rounded back. Keep hips level with knees. Avoid crossing legs for long periods. Copyright  VHI. All rights reserved.  Chronic neck strain can develop because of poor posture and faulty work habits  Postural strain related to slumped sitting and forward head posture is a leading cause of headaches, neck and upper back pain  General strengthening and flexibility exercises are helpful in the treatment of neck pain.  Most importantly, you should learn to correct the posture that may be contributing to chronic pain.   Change positions frequently  Change your work or home environment to improve posture and mechanics.   [facility] [address] [place], [place] [address] Phone # [phone] Fax  [phone]

## 2018-02-28 ENCOUNTER — Ambulatory Visit (INDEPENDENT_AMBULATORY_CARE_PROVIDER_SITE_OTHER): Payer: BLUE CROSS/BLUE SHIELD

## 2018-02-28 DIAGNOSIS — Z8679 Personal history of other diseases of the circulatory system: Secondary | ICD-10-CM | POA: Diagnosis not present

## 2018-02-28 DIAGNOSIS — R001 Bradycardia, unspecified: Secondary | ICD-10-CM | POA: Diagnosis not present

## 2018-03-07 ENCOUNTER — Ambulatory Visit: Payer: BLUE CROSS/BLUE SHIELD | Admitting: Physical Therapy

## 2018-03-07 ENCOUNTER — Encounter: Payer: Self-pay | Admitting: Physical Therapy

## 2018-03-07 DIAGNOSIS — M6281 Muscle weakness (generalized): Secondary | ICD-10-CM

## 2018-03-07 DIAGNOSIS — R252 Cramp and spasm: Secondary | ICD-10-CM

## 2018-03-07 DIAGNOSIS — M542 Cervicalgia: Secondary | ICD-10-CM

## 2018-03-07 NOTE — Therapy (Signed)
[facility] [address] [place], [place], [address] Phone: [phone]   Fax:  [phone]  Physical Therapy Treatment  Patient Details  Name: Amber Nunez MRN: [id number] Date of Birth: 12-Sep-1951 Referring Provider: Martinique, Betty G, MD   Encounter Date: 03/07/2018  PT End of Session - 03/07/18 1539    Visit Number  4    Number of Visits  8    Date for PT Re-Evaluation  04/09/18    Authorization Type  BCBS    PT Start Time  1533    PT Stop Time  1615    PT Time Calculation (min)  42 min    Activity Tolerance  Patient tolerated treatment well    Behavior During Therapy  [facility] for tasks assessed/performed       Past Medical History:  Diagnosis Date  . Abnormal electrocardiogram (ECG) (EKG)   . Bilateral carotid artery stenosis   . Chest pain   . Essential hypertension, benign   . Familial hypercholesterolemia   . Hx of dizziness   . Hx of syncope   . Palpitations   . Sinus bradycardia   . SVT (supraventricular tachycardia) ([place])   . Wolff-Parkinson-White (WPW) syndrome     Past Surgical History:  Procedure Laterality Date  . ABLATION  2000   had 2 for WPW  . BREAST SURGERY     BIOPSY, 3 YEARS AGO  . MOLE REMOVAL    . OVARIES REMOVED      There were no vitals filed for this visit.  Subjective Assessment - 03/07/18 1535    Subjective  States she was a little sore after previous session.  My foot has been doing more tingling and usually when sitting    Patient Stated Goals  feel stronger and not have burning in elbow in the morning    Currently in Pain?  No/denies                No data recorded       [facility] Adult PT Treatment/Exercise - 03/07/18 0001      Neck Exercises: Seated   Neck Retraction  10 reps;5 secs cervical reatraction with hands behind head for tactile cue      Neck Exercises: Supine   Cervical Isometrics  Extension;Flexion;5 secs;5 reps capital flexion  with cervical extension    Capital Flexion  10 reps;5 secs    Shoulder ABduction  Both;15 reps yellow band; keeping slight cervical ext    Upper Extremity D1  Theraband;15 reps yellow band; keeping slight cervical ext      Manual Therapy   Manual Therapy  Myofascial release;Joint mobilization    Joint Mobilization  gentle grade I-II lateral glides to C3    Myofascial Release  suboccipitals, cervical paraspinals               PT Short Term Goals - 02/12/18 1720      PT SHORT TERM GOAL #1   Title  ind with initial HEP    Time  4    Period  Weeks    Status  New    Target Date  03/12/18      PT SHORT TERM GOAL #2   Title  MMT shoulder flexion and extension 5/5 bilaterally due to improved postural stability    Period  Weeks    Status  New    Target Date  03/12/18        PT Long Term Goals -  02/12/18 1721      PT LONG TERM GOAL #1   Title  ind with advanced HEP    Time  8    Period  Weeks    Status  New    Target Date  04/09/18      PT LONG TERM GOAL #2   Title  pt reports waking up in the morning with no burning or radicular symptoms in elbow or shoulder    Time  8    Period  Weeks    Status  New    Target Date  04/09/18      PT LONG TERM GOAL #3   Title  Pt has no palpable trigger points in cervical muscles in order to reduce nerve compression and aggravation of radicular symptoms.    Time  8    Period  Weeks    Status  New    Target Date  04/09/18      PT LONG TERM GOAL #4   Period  --    Status  --    Target Date  --            Plan - 03/07/18 1616    Clinical Impression Statement  Patient did well with cervical movements in supine.  She needed more cues when performing retraction in seated position.  Pt did well with exercises and was able to progress resistance exercses.  Pt tolerated MFR well and will f/u next time to see weather any changes.  Pt will benefit from skilled PT to address psotural strength for reduced symptoms of tingling.     PT Treatment/Interventions  Biofeedback;Electrical Stimulation;Cryotherapy;Moist Heat;Traction;Therapeutic activities;Therapeutic exercise;Functional mobility training;Neuromuscular re-education;Patient/family education;Manual techniques;Passive range of motion;Dry needling;Taping    PT Next Visit Plan  thoracic extension, review and strengthen posture as needed, manual therapy as needed if benefitted patient    Consulted and Agree with Plan of Care  Patient       Patient will benefit from skilled therapeutic intervention in order to improve the following deficits and impairments:  Pain, Increased fascial restricitons, Increased muscle spasms, Decreased strength  Visit Diagnosis: Cervicalgia  Cramp and spasm  Muscle weakness (generalized)     Problem List Patient Active Problem List   Diagnosis Date Noted  . Bradycardia 02/15/2018  . Cervicalgia 01/30/2018  . WPW (Wolff-Parkinson-White syndrome) 01/05/2018  . Hyperlipidemia 12/29/2017  . Melanoma of skin ([place]) 12/29/2017    Zannie Cove, PT 03/07/2018, 5:13 PM  [facility] [address] [place], [place], [address] Phone: [phone]   Fax:  [phone]  Name: Amber Nunez MRN: [id number] Date of Birth: 16-Sep-1951

## 2018-03-12 ENCOUNTER — Ambulatory Visit: Payer: BLUE CROSS/BLUE SHIELD | Attending: Family Medicine | Admitting: Physical Therapy

## 2018-03-12 ENCOUNTER — Encounter: Payer: Self-pay | Admitting: Physical Therapy

## 2018-03-12 DIAGNOSIS — M6281 Muscle weakness (generalized): Secondary | ICD-10-CM | POA: Diagnosis present

## 2018-03-12 DIAGNOSIS — M542 Cervicalgia: Secondary | ICD-10-CM | POA: Diagnosis present

## 2018-03-12 DIAGNOSIS — R252 Cramp and spasm: Secondary | ICD-10-CM | POA: Insufficient documentation

## 2018-03-12 NOTE — Therapy (Signed)
[facility] [address] [place], [place], [address] Phone: [phone]   Fax:  [phone]  Physical Therapy Treatment  Patient Details  Name: Amber Nunez MRN: [id number] Date of Birth: October 18, 1951 Referring Provider: Martinique, Betty G, MD   Encounter Date: 03/12/2018  PT End of Session - 03/12/18 1541    Visit Number  5    Number of Visits  8    Date for PT Re-Evaluation  04/09/18    Authorization Type  BCBS    PT Start Time  1537    PT Stop Time  1616    PT Time Calculation (min)  39 min    Activity Tolerance  Patient tolerated treatment well    Behavior During Therapy  [facility] for tasks assessed/performed       Past Medical History:  Diagnosis Date  . Abnormal electrocardiogram (ECG) (EKG)   . Bilateral carotid artery stenosis   . Chest pain   . Essential hypertension, benign   . Familial hypercholesterolemia   . Hx of dizziness   . Hx of syncope   . Palpitations   . Sinus bradycardia   . SVT (supraventricular tachycardia) ([place])   . Wolff-Parkinson-White (WPW) syndrome     Past Surgical History:  Procedure Laterality Date  . ABLATION  2000   had 2 for WPW  . BREAST SURGERY     BIOPSY, 3 YEARS AGO  . MOLE REMOVAL    . OVARIES REMOVED      There were no vitals filed for this visit.  Subjective Assessment - 03/12/18 1542    Subjective  I am getting better. I feel a little tightness today, no pain.    Currently in Pain?  No/denies    Multiple Pain Sites  No                       [facility] Adult PT Treatment/Exercise - 03/12/18 0001      Neck Exercises: Machines for Strengthening   UBE (Upper Arm Bike)  L2 3x3 with pillow at back VC for posture      Neck Exercises: Seated   Cervical Isometrics  Flexion;Extension;Right lateral flexion;Left lateral flexion;5 secs;5 reps    Neck Retraction  10 reps;5 secs cervical reatraction with hands behind head for tactile cue    Other  Seated Exercise  thoracic extension seated over the back rest 10x      Neck Exercises: Supine   Upper Extremity D2  Flexion;15 reps;Theraband    Theraband Level (UE D2)  Level 1 (Yellow)      Manual Therapy   Manual Therapy  Myofascial release;Joint mobilization    Myofascial Release  suboccipitals, cervical paraspinals               PT Short Term Goals - 03/12/18 1604      PT SHORT TERM GOAL #1   Title  ind with initial HEP    Time  4    Period  Weeks    Status  Achieved        PT Long Term Goals - 03/12/18 1604      PT LONG TERM GOAL #2   Title  pt reports waking up in the morning with no burning or radicular symptoms in elbow or shoulder    Time  8    Period  Weeks    Status  On-going            Plan -  03/12/18 1541    Clinical Impression Statement  Pt continues to complain of burning into her elbows and shoulders in the morning. Througout the day she reports going very well as a result of her exercises and manual techniques performed in PT. Pt tolerates all stengthening exercises well, with no exacerbation of symptoms. She had a difficult time with learning how to extend her thoaracic spine initially but caught on quickly.      Rehab Potential  Excellent    PT Frequency  1x / week    PT Duration  8 weeks    PT Treatment/Interventions  Biofeedback;Electrical Stimulation;Cryotherapy;Moist Heat;Traction;Therapeutic activities;Therapeutic exercise;Functional mobility training;Neuromuscular re-education;Patient/family education;Manual techniques;Passive range of motion;Dry needling;Taping    PT Next Visit Plan  thoracic extension, review and strengthen posture as needed, manual therapy as needed if benefitted patient    Consulted and Agree with Plan of Care  Patient       Patient will benefit from skilled therapeutic intervention in order to improve the following deficits and impairments:  Pain, Increased fascial restricitons, Increased muscle spasms, Decreased  strength  Visit Diagnosis: Cervicalgia  Cramp and spasm  Muscle weakness (generalized)     Problem List Patient Active Problem List   Diagnosis Date Noted  . Bradycardia 02/15/2018  . Cervicalgia 01/30/2018  . WPW (Wolff-Parkinson-White syndrome) 01/05/2018  . Hyperlipidemia 12/29/2017  . Melanoma of skin ([place]) 12/29/2017    Sebastain Fishbaugh, PTA 03/12/2018, 4:25 PM  [facility] [address], [place], [place], [address] Phone: [phone]   Fax:  [phone]  Name: Amber Nunez MRN: [id number] Date of Birth: 1951-10-06

## 2018-03-15 ENCOUNTER — Encounter: Payer: Self-pay | Admitting: Internal Medicine

## 2018-03-22 ENCOUNTER — Encounter: Payer: BLUE CROSS/BLUE SHIELD | Admitting: Physical Therapy

## 2018-03-27 ENCOUNTER — Encounter: Payer: Self-pay | Admitting: Physical Therapy

## 2018-03-27 ENCOUNTER — Ambulatory Visit: Payer: BLUE CROSS/BLUE SHIELD | Admitting: Physical Therapy

## 2018-03-27 ENCOUNTER — Encounter: Payer: Self-pay | Admitting: Internal Medicine

## 2018-03-27 DIAGNOSIS — M6281 Muscle weakness (generalized): Secondary | ICD-10-CM

## 2018-03-27 DIAGNOSIS — R252 Cramp and spasm: Secondary | ICD-10-CM

## 2018-03-27 DIAGNOSIS — M542 Cervicalgia: Secondary | ICD-10-CM | POA: Diagnosis not present

## 2018-03-27 NOTE — Therapy (Signed)
[facility] [address] [place], [place], [address] Phone: [phone]   Fax:  [phone]  Physical Therapy Treatment  Patient Details  Name: Amber Nunez MRN: [id number] Date of Birth: 12-Aug-1951 Referring Provider: Martinique, Betty G, MD   Encounter Date: 03/27/2018  PT End of Session - 03/27/18 1536    Visit Number  6    Number of Visits  8    Date for PT Re-Evaluation  04/09/18    Authorization Type  BCBS    PT Start Time  1533    PT Stop Time  1614    PT Time Calculation (min)  41 min    Activity Tolerance  Patient tolerated treatment well    Behavior During Therapy  [facility] for tasks assessed/performed       Past Medical History:  Diagnosis Date  . Abnormal electrocardiogram (ECG) (EKG)   . Bilateral carotid artery stenosis   . Chest pain   . Essential hypertension, benign   . Familial hypercholesterolemia   . Hx of dizziness   . Hx of syncope   . Palpitations   . Sinus bradycardia   . SVT (supraventricular tachycardia) ([place])   . Wolff-Parkinson-White (WPW) syndrome     Past Surgical History:  Procedure Laterality Date  . ABLATION  2000   had 2 for WPW  . BREAST SURGERY     BIOPSY, 3 YEARS AGO  . MOLE REMOVAL    . OVARIES REMOVED      There were no vitals filed for this visit.  Subjective Assessment - 03/27/18 1539    Subjective  I feel like I am getting better and feeling the pain in the shoulder and not as much in the elbow.  I want to come back in 2 weeks to see how I do on  my own before returning .     Patient Stated Goals  feel stronger and not have burning in elbow in the morning    Currently in Pain?  No/denies                       [facility] Adult PT Treatment/Exercise - 03/27/18 0001      Neck Exercises: Supine   Cervical Isometrics  Extension;Flexion;5 secs;5 reps capital flexion with cervical extension    Upper Extremity D1  Theraband;15 reps yellow band;  keeping slight cervical ext    Upper Extremity D2  Flexion;15 reps;Theraband    Theraband Level (UE D2)  Level 1 (Yellow)    Other Supine Exercise  towel roll for thoracic extension in supine    Other Supine Exercise  horizontal abduction yellow band- 20x      Manual Therapy   Myofascial Release  suboccipitals, cervical paraspinals             PT Education - 03/27/18 1613    Education provided  Yes    Education Details   [id number]     Person(s) Educated  Patient    Methods  Explanation;Demonstration;Handout;Verbal cues;Tactile cues    Comprehension  Verbalized understanding;Returned demonstration       PT Short Term Goals - 03/27/18 1715      PT SHORT TERM GOAL #1   Title  ind with initial HEP    Time  4    Period  Weeks    Status  Achieved      PT SHORT TERM GOAL #2   Title  MMT shoulder flexion and extension  5/5 bilaterally due to improved postural stability    Time  4    Period  Weeks    Status  Achieved        PT Long Term Goals - 03/27/18 1709      PT LONG TERM GOAL #1   Title  ind with advanced HEP    Time  8    Period  Weeks    Status  On-going      PT LONG TERM GOAL #2   Title  pt reports waking up in the morning with no burning or radicular symptoms in elbow or shoulder    Baseline  much less    Time  8    Period  Weeks    Status  On-going      PT LONG TERM GOAL #3   Title  Pt has no palpable trigger points in cervical muscles in order to reduce nerve compression and aggravation of radicular symptoms.    Baseline  no trigger points, but some fascial restriction around suboccipitals    Time  8    Period  Weeks    Status  Achieved            Plan - 03/27/18 1711    Clinical Impression Statement  Patient is doing well with exercises at home.  She reports feeling almost fully improved. Pt has not had reduced pain that has been consistent yet so she is going to return for next visit in 2 weeks.  Pt demonstrates decreased reduced trigger  points and able to demonstrate good posture when performing HEP.  Pt will benefit from skilled PT to ensure successful transition to HEP    PT Treatment/Interventions  Biofeedback;Electrical Stimulation;Cryotherapy;Moist Heat;Traction;Therapeutic activities;Therapeutic exercise;Functional mobility training;Neuromuscular re-education;Patient/family education;Manual techniques;Passive range of motion;Dry needling;Taping    PT Next Visit Plan  thoracic extension, review and strengthen posture as needed, manual therapy as needed if benefitted patient; most likely discharge next    Consulted and Agree with Plan of Care  Patient       Patient will benefit from skilled therapeutic intervention in order to improve the following deficits and impairments:  Pain, Increased fascial restricitons, Increased muscle spasms, Decreased strength  Visit Diagnosis: Cervicalgia  Cramp and spasm  Muscle weakness (generalized)     Problem List Patient Active Problem List   Diagnosis Date Noted  . Bradycardia 02/15/2018  . Cervicalgia 01/30/2018  . WPW (Wolff-Parkinson-White syndrome) 01/05/2018  . Hyperlipidemia 12/29/2017  . Melanoma of skin ([place]) 12/29/2017    Zannie Cove 03/27/2018, 5:16 PM  [facility] [address], [place], [place], [address] Phone: [phone]   Fax:  [phone]  Name: Amber Nunez MRN: [id number] Date of Birth: Mar 14, 1951

## 2018-03-27 NOTE — Patient Instructions (Signed)
Access Code: [email or web address]  URL: [email or web address]  Date: 03/27/2018  Prepared by: Lovett Calender   Exercises  Hooklying Shoulder T - 10 reps - 3 sets - 1x daily - 7x weekly  Standing Eccentric Shoulder Exercise PNF D2 Pattern - 10 reps - 3 sets - 1x daily - 7x weekly  Thoracic Extension Mobilization with Noodle - 10 reps - 3 sets - 1x daily - 7x weekly  Sidelying Open Book Thoracic Lumbar Rotation and Extension - 10 reps - 3 sets - 1x daily - 7x weekly  Supine Cervical Retraction with Towel - 10 reps - 3 sets - 1x daily - 7x weekly  Shoulder External Rotation and Scapular Retraction with Resistance - 10 reps - 3 sets - 1x daily - 7x weekly

## 2018-04-07 ENCOUNTER — Encounter: Payer: Self-pay | Admitting: Internal Medicine

## 2018-04-11 ENCOUNTER — Ambulatory Visit: Payer: BLUE CROSS/BLUE SHIELD | Attending: Family Medicine | Admitting: Physical Therapy

## 2018-04-11 ENCOUNTER — Encounter: Payer: Self-pay | Admitting: Physical Therapy

## 2018-04-11 DIAGNOSIS — R252 Cramp and spasm: Secondary | ICD-10-CM

## 2018-04-11 DIAGNOSIS — M6281 Muscle weakness (generalized): Secondary | ICD-10-CM | POA: Diagnosis present

## 2018-04-11 DIAGNOSIS — M542 Cervicalgia: Secondary | ICD-10-CM | POA: Diagnosis not present

## 2018-04-11 NOTE — Therapy (Addendum)
[facility] [address] [place], [place], [address] Phone: [phone]   Fax:  [phone]  Physical Therapy Treatment  Patient Details  Name: Amber Nunez MRN: [id number] Date of Birth: 1951/08/28 Referring Provider: [place], Betty G, MD   Encounter Date: 04/11/2018  PT End of Session - 04/11/18 1618    Visit Number  7    Number of Visits  8    Date for PT Re-Evaluation  04/09/18    Authorization Type  BCBS    PT Start Time  1618    PT Stop Time  1647    PT Time Calculation (min)  29 min    Activity Tolerance  Patient tolerated treatment well    Behavior During Therapy  [facility] for tasks assessed/performed       Past Medical History:  Diagnosis Date  . Abnormal electrocardiogram (ECG) (EKG)   . Bilateral carotid artery stenosis   . Chest pain   . Essential hypertension, benign   . Familial hypercholesterolemia   . Hx of dizziness   . Hx of syncope   . Palpitations   . Sinus bradycardia   . SVT (supraventricular tachycardia) ([place])   . Wolff-Parkinson-White (WPW) syndrome     Past Surgical History:  Procedure Laterality Date  . ABLATION  2000   had 2 for WPW  . BREAST SURGERY     BIOPSY, 3 YEARS AGO  . MOLE REMOVAL    . OVARIES REMOVED      There were no vitals filed for this visit.  Subjective Assessment - 04/11/18 1621    Subjective  Continues to do well, today ok to be last day.     Limitations  Other (comment)    Patient Stated Goals  feel stronger and not have burning in elbow in the morning    Currently in Pain?  No/denies    Multiple Pain Sites  No         [facility] PT Assessment - 04/11/18 0001      Observation/Other Assessments   Focus on Therapeutic Outcomes (FOTO)   24% limited      Strength   Overall Strength Comments  Lt shld flexors bil 4+/5, BIl External Rotations 4+/5                    [facility] Adult PT Treatment/Exercise - 04/11/18 0001      Neck  Exercises: Supine   Upper Extremity D1  -- review 10x, pt independent    Upper Extremity D2  -- reviewed 10x pt independent    Other Supine Exercise  tried noodle instead of towel, 1 min 3 places around rib cage     Other Supine Exercise  horizontal abduction yellow band- 20x      Neck Exercises: Sidelying   Other Sidelying Exercise  Review of Sidelying exs given last sessio for HEP. 10x               PT Short Term Goals - 03/27/18 1715      PT SHORT TERM GOAL #1   Title  ind with initial HEP    Time  4    Period  Weeks    Status  Achieved      PT SHORT TERM GOAL #2   Title  MMT shoulder flexion and extension 5/5 bilaterally due to improved postural stability    Time  4    Period  Weeks    Status  Achieved  PT Long Term Goals - 04/11/18 1621      PT LONG TERM GOAL #1   Title  ind with advanced HEP    Time  8    Period  Weeks    Status  Achieved      PT LONG TERM GOAL #2   Title  pt reports waking up in the morning with no burning or radicular symptoms in elbow or shoulder    Baseline  much less    Period  Weeks    Status  Partially Met 75%      PT LONG TERM GOAL #3   Title  Pt has no palpable trigger points in cervical muscles in order to reduce nerve compression and aggravation of radicular symptoms.    Baseline  no trigger points, but some fascial restriction around suboccipitals    Time  8    Period  Weeks    Status  Achieved            Plan - 04/11/18 1623    Clinical Impression Statement  Burning in general is 75% improved despite a FOTO score that had not improved and she has been able to maintain this on her own. Pt has gained some strength in her shoulder flexors and external rotation. Pt is also independent in her last HEP given to her.     Rehab Potential  Excellent    PT Frequency  1x / week    PT Duration  8 weeks    PT Treatment/Interventions  Biofeedback;Electrical Stimulation;Cryotherapy;Moist Heat;Traction;Therapeutic  activities;Therapeutic exercise;Functional mobility training;Neuromuscular re-education;Patient/family education;Manual techniques;Passive range of motion;Dry needling;Taping    PT Next Visit Plan  Discharge per pt and plan of care.       Patient will benefit from skilled therapeutic intervention in order to improve the following deficits and impairments:  Pain, Increased fascial restricitons, Increased muscle spasms, Decreased strength  Visit Diagnosis: Cervicalgia  Cramp and spasm  Muscle weakness (generalized)     Problem List Patient Active Problem List   Diagnosis Date Noted  . Bradycardia 02/15/2018  . Cervicalgia 01/30/2018  . WPW (Wolff-Parkinson-White syndrome) 01/05/2018  . Hyperlipidemia 12/29/2017  . Melanoma of skin ([place]) 12/29/2017    Cincere Zorn, PTA 04/11/2018, 4:58 PM   [facility] [address], [place], [place], [address] Phone: [phone]   Fax:  [phone]  Name: Amber Nunez MRN: [id number] Date of Birth: Nov 22, 1951  PHYSICAL THERAPY DISCHARGE SUMMARY  Visits from Start of Care: 7  Current functional level related to goals / functional outcomes: See above   Remaining deficits: See above   Education / Equipment: HEP  Plan: Patient agrees to discharge.  Patient goals were partially met. Patient is being discharged due to financial reasons.  ?????    Patient met most of functional goals and was progressing towards meeting all, but her insurance changed and she was unsure of being able to afford returning for more visits  [employer], PT 04/13/18 10:05 AM

## 2018-04-13 NOTE — Addendum Note (Signed)
Addended by: Lovett Calender D on: 04/13/2018 10:07 AM   Modules accepted: Orders

## 2018-04-30 ENCOUNTER — Encounter: Payer: Self-pay | Admitting: Internal Medicine

## 2018-05-02 ENCOUNTER — Encounter: Payer: Self-pay | Admitting: Adult Health

## 2018-05-02 ENCOUNTER — Ambulatory Visit (INDEPENDENT_AMBULATORY_CARE_PROVIDER_SITE_OTHER): Payer: BLUE CROSS/BLUE SHIELD | Admitting: Adult Health

## 2018-05-02 ENCOUNTER — Ambulatory Visit: Payer: Self-pay | Admitting: *Deleted

## 2018-05-02 VITALS — BP 132/72 | Temp 98.1°F | Wt 128.0 lb

## 2018-05-02 DIAGNOSIS — R42 Dizziness and giddiness: Secondary | ICD-10-CM | POA: Diagnosis not present

## 2018-05-02 MED ORDER — FLUTICASONE PROPIONATE 50 MCG/ACT NA SUSP: 2.0000 | Freq: Every day | NASAL | 6 refills | Status: DC

## 2018-05-02 NOTE — Progress Notes (Signed)
Subjective:    Patient ID: Amber Nunez, female    DOB: February 13, 1951, 67 y.o.   MRN: [id number]  HPI 67 year old female who  has a past medical history of Abnormal electrocardiogram (ECG) (EKG), Bilateral carotid artery stenosis, Chest pain, Essential hypertension, benign, Familial hypercholesterolemia, dizziness, syncope, Palpitations, Sinus bradycardia, SVT (supraventricular tachycardia) (HCC), and Wolff-Parkinson-White (WPW) syndrome. She is a patient of Dr. Martinique, who I am seeing today for an acute issue of dizziness. She reports that her dizziness started this morning and has been intermittent throughout the day. She describes the dizziness as " the world is spinning". Dizziness is worse when laying down , changing positions, and looking horizontally.  She has been weaning herself down on Atenolol since January. She reports being able to go most of the week with 12.5 mg but last night she felt as though she was going through some withdraw symptoms and when she does this she will take a full 25 mg pill. She took a 25 mg tab before bed and woke up around 3 am with dizziness.   She denies any palpitations, chest pain, or shortness of breath.    Review of Systems See HPI   Past Medical History:  Diagnosis Date  . Abnormal electrocardiogram (ECG) (EKG)   . Bilateral carotid artery stenosis   . Chest pain   . Essential hypertension, benign   . Familial hypercholesterolemia   . Hx of dizziness   . Hx of syncope   . Palpitations   . Sinus bradycardia   . SVT (supraventricular tachycardia) ([place])   . Wolff-Parkinson-White (WPW) syndrome     Social History   Socioeconomic History  . Marital status: Married    Spouse name: Not on file  . Number of children: Not on file  . Years of education: Not on file  . Highest education level: Not on file  Occupational History  . Not on file  Social Needs  . Financial resource strain: Not on file  . Food insecurity:    Worry: Not on  file    Inability: Not on file  . Transportation needs:    Medical: Not on file    Non-medical: Not on file  Tobacco Use  . Smoking status: Former Smoker    Packs/day: 0.50    Years: 10.00    Pack years: 5.00    Types: Cigarettes    Last attempt to quit: 1990    Years since quitting: 29.4  . Smokeless tobacco: Never Used  Substance and Sexual Activity  . Alcohol use: Yes    Comment: One drink per month  . Drug use: No  . Sexual activity: Never    Partners: Male    Comment: MARRIEDE  Lifestyle  . Physical activity:    Days per week: Not on file    Minutes per session: Not on file  . Stress: Not on file  Relationships  . Social connections:    Talks on phone: Not on file    Gets together: Not on file    Attends religious service: Not on file    Active member of club or organization: Not on file    Attends meetings of clubs or organizations: Not on file    Relationship status: Not on file  . Intimate partner violence:    Fear of current or ex partner: Not on file    Emotionally abused: Not on file    Physically abused: Not on file    Forced  sexual activity: Not on file  Other Topics Concern  . Not on file  Social History Narrative  . Not on file    Past Surgical History:  Procedure Laterality Date  . ABLATION  2000   had 2 for WPW  . BREAST SURGERY     BIOPSY, 3 YEARS AGO  . MOLE REMOVAL    . OVARIES REMOVED      Family History  Problem Relation Age of Onset  . Heart disease Mother        CAD  . Heart disease Father   . Hyperlipidemia Father   . Heart attack Father 26       CABG X 3  . Early death Father   . Cancer Father   . Hyperlipidemia Brother     Allergies  Allergen Reactions  . Penicillins Nausea And Vomiting  . Atorvastatin   . Lortab [Hydrocodone-Acetaminophen]     Current Outpatient Medications on File Prior to Visit  Medication Sig Dispense Refill  . aspirin 81 MG tablet Take 81 mg by mouth daily.    Marland Kitchen atenolol (TENORMIN) 25 MG  tablet     . clobetasol cream (TEMOVATE) 0.05 %     . Multiple Vitamin (MULTIVITAMIN WITH MINERALS) TABS tablet Take 1 tablet by mouth daily.    . multivitamin-lutein (OCUVITE-LUTEIN) CAPS capsule Take 1 capsule by mouth daily.    . pravastatin (PRAVACHOL) 10 MG tablet     . tacrolimus (PROTOPIC) 0.03 % ointment Apply topically as needed.    . vitamin C (ASCORBIC ACID) 500 MG tablet Take 500 mg by mouth daily.     No current facility-administered medications on file prior to visit.     BP 132/72   Temp 98.1 F (36.7 C) (Oral)   Wt 128 lb (58.1 kg)   BMI 21.30 kg/m       Objective:   Physical Exam  Constitutional: She is oriented to person, place, and time. She appears well-developed and well-nourished. No distress.  HENT:  Right Ear: Tympanic membrane is bulging. Tympanic membrane is not erythematous.  Left Ear: Tympanic membrane is bulging. Tympanic membrane is not erythematous.  Nose: No mucosal edema or rhinorrhea. Right sinus exhibits no maxillary sinus tenderness and no frontal sinus tenderness. Left sinus exhibits no maxillary sinus tenderness and no frontal sinus tenderness.  Eyes: Pupils are equal, round, and reactive to light. Conjunctivae are normal. Right eye exhibits no discharge. Left eye exhibits no discharge. Right eye exhibits nystagmus (horizontal ). Left eye exhibits nystagmus (horizontal ).  Cardiovascular: Normal rate, regular rhythm, normal heart sounds and intact distal pulses. Exam reveals no gallop and no friction rub.  No murmur heard. Pulmonary/Chest: Effort normal and breath sounds normal. No stridor. No respiratory distress. She has no wheezes. She has no rales. She exhibits no tenderness.  Neurological: She is alert and oriented to person, place, and time.  Became dizzy when changing positions from supine to sitting.   Skin: Skin is warm. She is not diaphoretic.  Vitals reviewed.     Assessment & Plan:  1. Dizziness - Exam appears to be related more  to vertigo. She did have fluid behind bilateral TM's. EKG was normal. Doubt cardiac cause  - EKG 12-Lead - NSR, rate 63.  - Home exercises given for vertigo  - Advised she can trial flonase incase eustachian tube dysfunction is causing dizziness.   - Can also trial OTC low dose dramamine if not improved  - Consider imaging if no  improvement   Dorothyann Peng, NP

## 2018-05-02 NOTE — Telephone Encounter (Signed)
  Pt reports intermittent dizziness, onset 0330 this AM. States rom spinning at times, episodes last 10 seconds. States on Atenolol which dose was recently increased from 12.5 to 25mg . Took full dose at 2130 last night.  Pt states she did call cardiologist who is on vacation; directed to call PCP. Reports blurred vision at times with dizziness. States worse when "tilts head back." B/P at 0330 150'S/79 HR in 50'S. During call HR 63, B/P 148/77. Denies any chest pain, nausea, SOB. Appt made with C. Nafziger for 1445 today. Husband with pt, will drive. Care advise given per protocol.  Reason for Disposition . [1] MODERATE dizziness (e.g., vertigo; feels very unsteady, interferes with normal activities) AND [2] has NOT been evaluated by physician for this    On Atenolol, increased dose recently.  Answer Assessment - Initial Assessment Questions 1. DESCRIPTION: "Describe your dizziness."     Intermittent 2. VERTIGO: "Do you feel like either you or the room is spinning or tilting?"     At times 3. LIGHTHEADED: "Do you feel lightheaded?" (e.g., somewhat faint, woozy, weak upon standing)     Yes 4. SEVERITY: "How bad is it?"  "Can you walk?"   - MILD - Feels unsteady but walking normally.   - MODERATE - Feels very unsteady when walking, but not falling; interferes with normal activities (e.g., school, work) .   - SEVERE - Unable to walk without falling (requires assistance).      5. ONSET:  "When did the dizziness begin?"     0330 6. AGGRAVATING FACTORS: "Does anything make it worse?" (e.g., standing, change in head position)     Tilt head back 7. CAUSE: "What do you think is causing the dizziness?"     Maybe altenol 8. RECURRENT SYMPTOM: "Have you had dizziness before?" If so, ask: "When was the last time?" "What happened that time?"     For years 9. OTHER SYMPTOMS: "Do you have any other symptoms?" (e.g., headache, weakness, numbness, vomiting, earache)     Blurry vision during episodes, hot  flash  Protocols used: DIZZINESS - VERTIGO-A-AH

## 2018-05-02 NOTE — Telephone Encounter (Signed)
Noted  

## 2018-05-17 ENCOUNTER — Encounter: Payer: Self-pay | Admitting: Internal Medicine

## 2018-05-17 ENCOUNTER — Ambulatory Visit: Payer: BLUE CROSS/BLUE SHIELD | Admitting: Internal Medicine

## 2018-05-17 VITALS — BP 130/78 | HR 79 | Ht 62.0 in | Wt 131.0 lb

## 2018-05-17 DIAGNOSIS — Z8679 Personal history of other diseases of the circulatory system: Secondary | ICD-10-CM

## 2018-05-17 DIAGNOSIS — R002 Palpitations: Secondary | ICD-10-CM | POA: Diagnosis not present

## 2018-05-17 MED ORDER — BISOPROLOL FUMARATE 5 MG PO TABS: 5.0000 mg | ORAL_TABLET | Freq: Every day | ORAL | 3 refills | Status: DC

## 2018-05-17 NOTE — Patient Instructions (Addendum)
Medication Instructions:  STOP Atenolol START Bisoprolol Fumarate 5 mg daily  -- If you need a refill on your cardiac medications before your next appointment, please call your pharmacy. --  Labwork: None ordered  Testing/Procedures: None ordered  Follow-Up: Your physician wants you to follow-up in: 3 months with Dr. Saunders Revel.     Thank you for choosing [facility]!!    Any Other Special Instructions Will Be Listed Below (If Applicable).

## 2018-05-17 NOTE — Progress Notes (Signed)
Follow-up Outpatient Visit Date: 05/17/2018  Primary Care Provider: Martinique, Betty G, MD [address] [place] [address]  Chief Complaint: Follow-up palpitations  HPI:  Amber Nunez is a 67 y.o. 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  I met Amber Nunez in March, at which time she was concerned about a low heart rate on her Fitbit.  She was otherwise feeling well.  She had recently decreased atenolol to 12.5 mg daily, but due to persistently low heart rates, decided to go back to 25 mg daily.  She also noted a sporadic "vibrating" sensation in the chest, prompting [place] to obtain a 48-hour Holter monitor to exclude an underlying arrhythmia.  This showed rare PACs and PVCs with no significant arrhythmia.  Average heart rate was 66 bpm.  She has messaged me several times since then with intermittent palpitations and concerns about varying heart rates.  We discussed switching to a different beta-blocker, though the patient ultimately deferred this.  Today, Amber Nunez reports that she continues to have a "vibrating" sensation in her chest when she takes atenolol 12.5 mg daily.  If she increases the dose to 25 mg daily, the sensation is absent.  However, she also then notes that her resting heart rate dips into the mid 40's.  At times, she feels a bit sluggish when her heart rate falls this low.  When her HR drops into the 50's on her Fitbit, she will often stand up so that her pulse increases.  She denies chest pain, shortness of breath, lightheadedness, and falls.  She has noted intermittent "hot flashes," which concern her because these were also present when she was diagnosed with  WPW.  --------------------------------------------------------------------------------------------------  Cardiovascular History & Procedures: Cardiovascular Problems:  Wolff-Parkinson-White syndrome  Risk Factors:  Age greater than 31  Cath/PCI:  None available  CV Surgery:  None   EP Procedures and Devices:  48-hour Holter monitor (02/28/2018): Sinus rhythm with rare PACs and PVCs.  Average heart rate 66 bpm.  No significant arrhythmias.  EP study and accessory pathway ablation (2013 and 2014)  Non-Invasive Evaluation(s):  Exercise MPI (01/10/17): Normal myocardial perfusion without ischemia or scar. LVEF 80%.  Carotid artery Doppler (01/10/17): Mild bilateral internal and external carotid artery plaquing (less than 50% stenosis). Antegrade vertebral artery flow bilaterally.   Recent CV Pertinent Labs: Lab Results  Component Value Date   CHOL 190 01/09/2018   HDL 70.20 01/09/2018   LDLCALC 103 (H) 01/09/2018   TRIG 84.0 01/09/2018   CHOLHDL 3 01/09/2018   K 4.1 01/09/2018   BUN 19 01/09/2018   CREATININE 0.70 01/09/2018    Past medical and surgical history were reviewed and updated in EPIC.  Current Meds  Medication Sig  . aspirin 81 MG tablet Take 81 mg by mouth daily.  . clobetasol cream (TEMOVATE) 4.88 % Apply 1 application topically as needed. Apply one application of cream 8.91% to chest daily as needed for rash.  . fluticasone (FLONASE) 50 MCG/ACT nasal spray Place 2 sprays into both nostrils daily.  . Multiple Vitamin (MULTIVITAMIN WITH MINERALS) TABS tablet Take 1 tablet by mouth daily.  . multivitamin-lutein (OCUVITE-LUTEIN) CAPS capsule Take 1 capsule by mouth daily.  . pravastatin (PRAVACHOL) 10 MG tablet   . tacrolimus (PROTOPIC) 0.03 % ointment Apply  topically as needed.  . vitamin C (ASCORBIC ACID) 500 MG tablet Take 500 mg by mouth daily.  . [DISCONTINUED] atenolol (TENORMIN) 25 MG tablet Take 12.5 mg by mouth daily. Take 1/2 (12.2m ) of  25 mg tablet by mouth once daily.    Allergies: Penicillins; Atorvastatin; and Lortab [hydrocodone-acetaminophen]  Social History   Tobacco Use  . Smoking status: Former Smoker    Packs/day: 0.50    Years: 10.00    Pack years: 5.00    Types: Cigarettes    Last attempt to quit: 1990    Years since quitting: 29.4  . Smokeless tobacco: Never Used  Substance Use Topics  . Alcohol use: Yes    Comment: One drink per month  . Drug use: No    Family History  Problem Relation Age of Onset  . Heart disease Mother        CAD  . Heart disease Father   . Hyperlipidemia Father   . Heart attack Father 540      CABG X 3  . Early death Father   . Cancer Father   . Hyperlipidemia Brother     Review of Systems: A 12-system review of systems was performed and was negative except as noted in the HPI.  --------------------------------------------------------------------------------------------------  Physical Exam: BP 130/78   Pulse 79   Ht _0  (1.575 m)   Wt 131 lb (59.4 kg)   SpO2 98%   BMI 23.96 kg/m   General:  NAD HEENT: No conjunctival pallor or scleral icterus. Moist mucous membranes.  OP clear. Neck: Supple without lymphadenopathy, thyromegaly, JVD, or HJR. Lungs: Normal work of breathing. Clear to auscultation bilaterally without wheezes or crackles. Heart: Regular rate and rhythm without murmurs, rubs, or gallops. Non-displaced PMI. Abd: Bowel sounds present. Soft, NT/ND without hepatosplenomegaly Ext: No lower extremity edema. Radial, PT, and DP pulses are 2+ bilaterally. Skin: Warm and dry without rash.  EKG:  NSR (HR 72 bpm) without abnormalities.  No results found for: WBC, HGB, HCT, MCV, PLT  Lab Results  Component Value Date   NA 141 01/09/2018   K 4.1 01/09/2018   CL 102 01/09/2018   CO2 31 01/09/2018   BUN 19 01/09/2018   CREATININE 0.70 01/09/2018   GLUCOSE 91 01/09/2018   ALT 16 01/09/2018    Lab Results  Component Value Date   CHOL 190  01/09/2018   HDL 70.20 01/09/2018   LDLCALC 103 (H) 01/09/2018   TRIG 84.0 01/09/2018   CHOLHDL 3 01/09/2018    --------------------------------------------------------------------------------------------------  ASSESSMENT AND PLAN: Palpitations and history of WPW I am uncertain as to what the intermittent vibratory sensation noted by Amber Nunez represents.  Holter monitor showed PAC's and PVC's but no sustained arrhythmia.  We have discussed wearing a 30-day event monitor [place] switching to an alternative beta-blocker, switching to a calcium channel blocker, or referring her to EP.  We have agreed to stop atenolol and begin bisoprolol 5 mg daily to see if she tolerates this better from a palpitations and heart rate standpoint.  Follow-up: Return to clinic in 3 months.  CNelva Bush MD 05/18/2018 4:35 PM

## 2018-05-18 ENCOUNTER — Encounter: Payer: Self-pay | Admitting: Internal Medicine

## 2018-05-18 DIAGNOSIS — R002 Palpitations: Secondary | ICD-10-CM | POA: Insufficient documentation

## 2018-05-18 DIAGNOSIS — Z8679 Personal history of other diseases of the circulatory system: Secondary | ICD-10-CM | POA: Insufficient documentation

## 2018-05-24 ENCOUNTER — Encounter: Payer: Self-pay | Admitting: Internal Medicine

## 2018-05-25 ENCOUNTER — Telehealth: Payer: Self-pay | Admitting: Internal Medicine

## 2018-05-25 NOTE — Telephone Encounter (Signed)
Spoke with patient in regards to Dr Darnelle Bos email and possibly following up with an EP physician.  She will monitor her symptoms after making the medication recommendations from Dr End and inform us of her developments.  She said that she is familiar with Electrophysiologists because she had two ablations in [place], [place].

## 2018-05-25 NOTE — Telephone Encounter (Signed)
New Message:       Pt is calling and states she received and email stating that Dr. Saunders Revel wanted her to see and EP doctor.

## 2018-06-04 ENCOUNTER — Ambulatory Visit: Payer: BLUE CROSS/BLUE SHIELD | Admitting: Family Medicine

## 2018-06-04 ENCOUNTER — Encounter: Payer: Self-pay | Admitting: Family Medicine

## 2018-06-04 VITALS — BP 128/76 | HR 72 | Temp 98.3°F | Resp 12 | Ht 65.0 in | Wt 127.0 lb

## 2018-06-04 DIAGNOSIS — M659 Synovitis and tenosynovitis, unspecified: Secondary | ICD-10-CM | POA: Diagnosis not present

## 2018-06-04 NOTE — Progress Notes (Signed)
ACUTE VISIT   HPI:  Chief Complaint  Patient presents with  . Ankle Pain    x 1 week, medial left under the ankle, took asprin, some help not much,     [name] is a 67 y.o. female, who is here today complaining of a week of sudden onset of left ankle pain. Under medial malleolus.  No history of trauma, new shoes, or unusual long walk. Initially it was some mild edema and "a little pink."    She took OTC Aspirin and apply local ice. Mild limitation of movement due to pain. Problem has been improving.   Pain is exacerbated by movement, palpation, and walking. Alleviated by rest. Now pain is mild but it was "really painful."    Review of Systems  Constitutional: Negative for chills, fatigue and fever.  Respiratory: Negative for cough and shortness of breath.   Cardiovascular: Negative for chest pain, palpitations and leg swelling.  Musculoskeletal: Positive for arthralgias and joint swelling. Negative for back pain and gait problem.  Skin: Negative for rash and wound.  Neurological: Negative for weakness and numbness.      Current Outpatient Medications on File Prior to Visit  Medication Sig Dispense Refill  . aspirin 81 MG tablet Take 81 mg by mouth daily.    Marland Kitchen atenolol (TENORMIN) 25 MG tablet Take 6 mg by mouth daily.    . clobetasol cream (TEMOVATE) 4.27 % Apply 1 application topically as needed. Apply one application of cream 0.62% to chest daily as needed for rash.    . fluticasone (FLONASE) 50 MCG/ACT nasal spray Place 2 sprays into both nostrils daily. (Patient taking differently: Place 1 spray into both nostrils daily. ) 16 g 6  . Multiple Vitamin (MULTIVITAMIN WITH MINERALS) TABS tablet Take 1 tablet by mouth daily.    . multivitamin-lutein (OCUVITE-LUTEIN) CAPS capsule Take 1 capsule by mouth daily.    . pravastatin (PRAVACHOL) 10 MG tablet     . tacrolimus (PROTOPIC) 0.03 % ointment Apply topically as needed.    . vitamin C (ASCORBIC  ACID) 500 MG tablet Take 500 mg by mouth daily.    . bisoprolol (ZEBETA) 5 MG tablet Take 1 tablet (5 mg total) by mouth daily. (Patient not taking: Reported on 06/04/2018) 90 tablet 3   No current facility-administered medications on file prior to visit.      Past Medical History:  Diagnosis Date  . Abnormal electrocardiogram (ECG) (EKG)   . Bilateral carotid artery stenosis   . Chest pain   . Essential hypertension, benign   . Familial hypercholesterolemia   . Hx of dizziness   . Hx of syncope   . Palpitations   . Sinus bradycardia   . SVT (supraventricular tachycardia) ([place])   . Wolff-Parkinson-White (WPW) syndrome    Allergies  Allergen Reactions  . Penicillins Nausea And Vomiting  . Atorvastatin   . Lortab [Hydrocodone-Acetaminophen]   . Zebeta [Bisoprolol Fumarate] Rash    Social History   Socioeconomic History  . Marital status: Married    Spouse name: Not on file  . Number of children: Not on file  . Years of education: Not on file  . Highest education level: Not on file  Occupational History  . Not on file  Social Needs  . Financial resource strain: Not on file  . Food insecurity:    Worry: Not on file    Inability: Not on file  . Transportation needs:    Medical:  Not on file    Non-medical: Not on file  Tobacco Use  . Smoking status: Former Smoker    Packs/day: 0.50    Years: 10.00    Pack years: 5.00    Types: Cigarettes    Last attempt to quit: 1990    Years since quitting: 29.4  . Smokeless tobacco: Never Used  Substance and Sexual Activity  . Alcohol use: Yes    Comment: One drink per month  . Drug use: No  . Sexual activity: Never    Partners: Male    Comment: MARRIEDE  Lifestyle  . Physical activity:    Days per week: Not on file    Minutes per session: Not on file  . Stress: Not on file  Relationships  . Social connections:    Talks on phone: Not on file    Gets together: Not on file    Attends religious service: Not on file     Active member of club or organization: Not on file    Attends meetings of clubs or organizations: Not on file    Relationship status: Not on file  Other Topics Concern  . Not on file  Social History Narrative  . Not on file    Vitals:   06/04/18 1434  BP: 128/76  Pulse: 72  Resp: 12  Temp: 98.3 F (36.8 C)  SpO2: 96%   Body mass index is 23.23 kg/m.   Physical Exam  Nursing note and vitals reviewed. Constitutional: She is oriented to person, place, and time. She appears well-developed and well-nourished. She does not appear ill. No distress.  HENT:  Head: Normocephalic and atraumatic.  Eyes: Conjunctivae are normal.  Cardiovascular: Normal rate and regular rhythm.  Pulses:      Dorsalis pedis pulses are 2+ on the left side.       Posterior tibial pulses are 2+ on the left side.  Respiratory: Effort normal and breath sounds normal. No respiratory distress.  Musculoskeletal: She exhibits no edema.       Left ankle: She exhibits normal range of motion, no ecchymosis and normal pulse. No lateral malleolus and no medial malleolus tenderness found. Achilles tendon exhibits no pain and no defect.       Feet:  Mild tenderness anterior flexion. No bony tenderness. No edema or erythema.  Neurological: She is alert and oriented to person, place, and time. She has normal strength. Gait normal.  Skin: Skin is warm. No rash noted. No erythema.  Psychiatric: She has a normal mood and affect. Her speech is normal.  Well groomed, good eye contact.      ASSESSMENT AND PLAN:  [name] was seen today for ankle pain.  Diagnoses and all orders for this visit:  Tenosynovitis of ankle  Problem is improving. I do not think imaging is needed today since there is not a history of trauma. OTC IcyHot or Aspercreme with lidocaine may help. Caution with taking extra Aspirin, she is already on daily Aspirin 81 mg daily. Continue local ice. If pain worsens recommend arrange an  appointment with podiatrist.    Return if symptoms worsen or fail to improve.      Zsazsa Bahena G. Martinique, MD  [facility]. [place] office.

## 2018-06-11 ENCOUNTER — Encounter: Payer: Self-pay | Admitting: Internal Medicine

## 2018-06-13 ENCOUNTER — Other Ambulatory Visit: Payer: Self-pay

## 2018-06-13 DIAGNOSIS — R002 Palpitations: Secondary | ICD-10-CM

## 2018-06-27 ENCOUNTER — Ambulatory Visit (INDEPENDENT_AMBULATORY_CARE_PROVIDER_SITE_OTHER): Payer: BLUE CROSS/BLUE SHIELD

## 2018-06-27 DIAGNOSIS — R002 Palpitations: Secondary | ICD-10-CM

## 2018-07-17 ENCOUNTER — Encounter: Payer: Self-pay | Admitting: Internal Medicine

## 2018-08-02 ENCOUNTER — Encounter: Payer: Self-pay | Admitting: Family Medicine

## 2018-08-07 ENCOUNTER — Other Ambulatory Visit: Payer: Self-pay | Admitting: *Deleted

## 2018-08-07 ENCOUNTER — Other Ambulatory Visit: Payer: Self-pay | Admitting: Internal Medicine

## 2018-08-07 DIAGNOSIS — Z1231 Encounter for screening mammogram for malignant neoplasm of breast: Secondary | ICD-10-CM

## 2018-08-07 MED ORDER — ATENOLOL 25 MG PO TABS: 12.5000 mg | ORAL_TABLET | Freq: Every day | ORAL | 3 refills | Status: DC

## 2018-08-10 ENCOUNTER — Other Ambulatory Visit: Payer: Self-pay | Admitting: Family Medicine

## 2018-08-10 DIAGNOSIS — Z1231 Encounter for screening mammogram for malignant neoplasm of breast: Secondary | ICD-10-CM

## 2018-08-28 ENCOUNTER — Telehealth: Payer: Self-pay

## 2018-08-28 ENCOUNTER — Other Ambulatory Visit: Payer: Self-pay

## 2018-08-28 MED ORDER — PRAVASTATIN SODIUM 10 MG PO TABS: 10.0000 mg | ORAL_TABLET | Freq: Every day | ORAL | 3 refills | Status: DC

## 2018-08-28 NOTE — Telephone Encounter (Signed)
Spoke to patient about refill for Pravastatin.  I told her that I would check with Dr End and advise.  She verbalized understanding.

## 2018-08-28 NOTE — Telephone Encounter (Signed)
Informed patient that we will refill Pravastatin.

## 2018-08-28 NOTE — Telephone Encounter (Signed)
Prescription refilled.

## 2018-09-03 ENCOUNTER — Ambulatory Visit
Admission: RE | Admit: 2018-09-03 | Discharge: 2018-09-03 | Disposition: A | Discharge status: Home / Self Care | Payer: BLUE CROSS/BLUE SHIELD | Source: Ambulatory Visit | Attending: Family Medicine | Admitting: Family Medicine

## 2018-09-03 DIAGNOSIS — Z1231 Encounter for screening mammogram for malignant neoplasm of breast: Secondary | ICD-10-CM

## 2018-09-05 ENCOUNTER — Other Ambulatory Visit: Payer: Self-pay | Admitting: Family Medicine

## 2018-09-05 DIAGNOSIS — R921 Mammographic calcification found on diagnostic imaging of breast: Secondary | ICD-10-CM

## 2018-09-05 NOTE — Progress Notes (Signed)
.   Follow-up Outpatient Visit Date: 09/06/2018  Primary Care Provider: Martinique, Betty G, [place] [address]  Chief Complaint: Follow-up palpitations  HPI:  Amber Nunez is a 67 y.o. year-old female with history of WPW status post accessory pathway [place] x [place] ([place] in 2014), HTN, HLD, carotid artery stenosis, and atypical chest pain with negative myocardial perfusion stress test (12/2016), who presents for follow-up of palpitations.  I last saw her in June, which time she continued to report palpitations.  Subsequent event monitor showed predominantly sinus rhythm without significant arrhythmia.  Patient triggered events corresponded to sinus rhythm with artifact.  The patient has been adjusting her atenolol dose quite a bit on her own.  Today, the patient reports that she has been doing well though she continues to note occasional slow heart rates into the 40s.  She feels as though she may become lightheadedness when her heart rate is this low, though she is otherwise asymptomatic.  Her heart rate seems to come up quickly with minimal activity.  She continues to have sporadic palpitations, usually a brief "electrical" feeling lasting a second or two.  She remains on atenolol 12.5 mg daily, which she typically takes around dinnertime.  She is also concerned that pravastatin may be causing some bradycardia based on something that she has read.  She denies chest pain, shortness of breath, orthopnea, PND, and edema.  The patient notes that she has experienced some hair loss over the last few months as well as continued unintentional weight loss.  Recent mammogram resulted in a recall for additional pictures, potentially related to site of prior breast biopsy.  --------------------------------------------------------------------------------------------------  Cardiovascular History & Procedures: Cardiovascular  Problems:  Wolff-Parkinson-White syndrome  Risk Factors:  Age greater than 60  Cath/PCI:  None available  CV Surgery:  None  EP Procedures and Devices:  30-day event monitor (06/27/2018): Predominantly sinus rhythm without significant arrhythmia.  Patient triggered events correspond to sinus rhythm and artifact.  48-hour Holter monitor (02/28/2018): Sinus rhythm with rare PACs and PVCs.  Average heart rate 66 bpm.  No significant arrhythmias.  EP study and accessory pathway ablation (2013 and 2014)  Non-Invasive Evaluation(s):  Exercise MPI (01/10/17): Normal myocardial perfusion without ischemia or scar. LVEF 80%.  Carotid artery Doppler (01/10/17): Mild bilateral internal and external carotid artery plaquing (less than 50% stenosis). Antegrade vertebral artery flow bilaterally.  Recent CV Pertinent Labs: Lab Results  Component Value Date   CHOL 190 01/09/2018   HDL 70.20 01/09/2018   LDLCALC 103 (H) 01/09/2018   TRIG 84.0 01/09/2018   CHOLHDL 3 01/09/2018   K 4.1 01/09/2018   BUN 19 01/09/2018   CREATININE 0.70 01/09/2018    Past medical and surgical history were reviewed and updated in EPIC.  Current Meds  Medication Sig  . aspirin 81 MG tablet Take 81 mg by mouth daily.  Marland Kitchen atenolol (TENORMIN) 25 MG tablet Take 0.5 tablets (12.5 mg total) by mouth daily.  . clobetasol cream (TEMOVATE) 7.35 % Apply 1 application topically as needed. Apply one application of cream 3.29% to chest daily as needed for rash.  . Multiple Vitamin (MULTIVITAMIN WITH MINERALS) TABS tablet Take 1 tablet by mouth daily.  . multivitamin-lutein (OCUVITE-LUTEIN) CAPS capsule Take 1 capsule by mouth daily.  . pravastatin (PRAVACHOL) 10 MG tablet Take 1 tablet (10 mg total) by mouth daily.  . tacrolimus (PROTOPIC) 0.03 % ointment Apply topically as needed.  . vitamin C (  ASCORBIC ACID) 500 MG tablet Take 500 mg by mouth daily.    Allergies: Penicillins; Atorvastatin; Lortab  [hydrocodone-acetaminophen]; and Zebeta [bisoprolol fumarate]  Social History   Tobacco Use  . Smoking status: Former Smoker    Packs/day: 0.50    Years: 10.00    Pack years: 5.00    Types: Cigarettes    Last attempt to quit: 1990    Years since quitting: 29.7  . Smokeless tobacco: Never Used  Substance Use Topics  . Alcohol use: Yes    Comment: One drink per month  . Drug use: No    Family History  Problem Relation Age of Onset  . Heart disease Mother        CAD  . Heart disease Father   . Hyperlipidemia Father   . Heart attack Father 53       CABG X 3  . Early death Father   . Cancer Father   . Hyperlipidemia Brother   . Breast cancer Neg Hx     Review of Systems: A 12-system review of systems was performed and was negative except as noted in the HPI.  --------------------------------------------------------------------------------------------------  Physical Exam: BP 138/80   Pulse 80   Ht 5\' 5"  (1.651 m)   Wt 123 lb (55.8 kg)   SpO2 98%   BMI 20.47 kg/m   General: NAD. HEENT: No conjunctival pallor or scleral icterus. Moist mucous membranes.  OP clear. Neck: Supple without lymphadenopathy, thyromegaly, JVD, or HJR. Lungs: Normal work of breathing. Clear to auscultation bilaterally without wheezes or crackles. Heart: Regular rate and rhythm without murmurs, rubs, or gallops. Non-displaced PMI. Abd: Bowel sounds present. Soft, NT/ND without hepatosplenomegaly Ext: No lower extremity edema. Radial, PT, and DP pulses are 2+ bilaterally. Skin: Warm and dry without rash.  EKG: Normal sinus rhythm without abnormalities.  No results found for: WBC, HGB, HCT, MCV, PLT  Lab Results  Component Value Date   NA 141 01/09/2018   K 4.1 01/09/2018   CL 102 01/09/2018   CO2 31 01/09/2018   BUN 19 01/09/2018   CREATININE 0.70 01/09/2018   GLUCOSE 91 01/09/2018   ALT 16 01/09/2018    Lab Results  Component Value Date   CHOL 190 01/09/2018   HDL 70.20  01/09/2018   LDLCALC 103 (H) 01/09/2018   TRIG 84.0 01/09/2018   CHOLHDL 3 01/09/2018    --------------------------------------------------------------------------------------------------  ASSESSMENT AND PLAN: Palpitations and history of WPW Rare, brief palpitations persist.  Recent Holter and subsequent 30-day event monitor did not show any significant arrhythmias.  EKG today remains normal without evidence of preexcitation.  Given concern for low heart rates, continued palpitations, weight loss, and hair loss, I will check a TSH.  In light of the patient's concerned that pravastatin may be contributing to bradycardia, we have agreed to a statin holiday (though I do not suspect that pravastatin is causing her low heart rates).  If bradycardia persists in the setting of normal TSH and statin holiday, we will need to try holding her atenolol.  Follow-up: Given my transition to the [place] office, the patient wishes to follow-up with Dr. Radford Pax in 3 months.  Nelva Bush, MD 09/07/2018 8:00 AM

## 2018-09-06 ENCOUNTER — Encounter: Payer: Self-pay | Admitting: Internal Medicine

## 2018-09-06 ENCOUNTER — Ambulatory Visit: Payer: BLUE CROSS/BLUE SHIELD | Admitting: Internal Medicine

## 2018-09-06 VITALS — BP 138/80 | HR 80 | Ht 65.0 in | Wt 123.0 lb

## 2018-09-06 DIAGNOSIS — Z8679 Personal history of other diseases of the circulatory system: Secondary | ICD-10-CM | POA: Diagnosis not present

## 2018-09-06 DIAGNOSIS — R002 Palpitations: Secondary | ICD-10-CM | POA: Diagnosis not present

## 2018-09-06 NOTE — Patient Instructions (Addendum)
Medication Instructions:  HOLD Pravastatin 2-3 weeks and see if HR improves  -- If you need a refill on your cardiac medications before your next appointment, please call your pharmacy. --  Labwork:TODAY  TSH  Testing/Procedures: None ordered  Follow-Up: Your physician wants you to follow-up in: 3 months with Dr. Radford Pax    Thank you for choosing [facility]!!    Any Other Special Instructions Will Be Listed Below (If Applicable).

## 2018-09-07 ENCOUNTER — Encounter: Payer: Self-pay | Admitting: Internal Medicine

## 2018-09-07 ENCOUNTER — Telehealth: Payer: Self-pay | Admitting: Family Medicine

## 2018-09-07 LAB — TSH: TSH: 1.44 u[IU]/mL (ref 0.450–4.500)

## 2018-09-07 NOTE — Telephone Encounter (Signed)
Copied from [place] [phone]. Topic: General - Other >> Sep 07, 2018 10:32 AM Bea Graff, NT wrote: Reason for CRM: [facility] to request the referral they faxed over to be signed and faxed back in order for this pt to keep her appt on Monday with them. Fax#: [phone] CB#: [phone].

## 2018-09-07 NOTE — Telephone Encounter (Signed)
Rx/forms faxed. Fax confirmation received. Order had also been electronically cosigned in [place].

## 2018-09-10 ENCOUNTER — Ambulatory Visit
Admission: RE | Admit: 2018-09-10 | Discharge: 2018-09-10 | Disposition: A | Discharge status: Home / Self Care | Payer: BLUE CROSS/BLUE SHIELD | Source: Ambulatory Visit | Attending: Family Medicine | Admitting: Family Medicine

## 2018-09-10 DIAGNOSIS — R921 Mammographic calcification found on diagnostic imaging of breast: Secondary | ICD-10-CM

## 2018-09-12 ENCOUNTER — Telehealth: Payer: Self-pay

## 2018-09-12 MED ORDER — ATENOLOL 25 MG PO TABS: 12.5000 mg | ORAL_TABLET | Freq: Every day | ORAL | 3 refills | Status: DC

## 2018-09-12 NOTE — Telephone Encounter (Signed)
Spoke to patient and told her that her Atenolol was renewed, but through [employer].  She requested [facility].  I told her that I would send it in.  She was very thankful.

## 2018-10-05 IMAGING — MG DIGITAL DIAGNOSTIC UNILATERAL RIGHT MAMMOGRAM
3 series · 3 of 3 positions shown · non-contrast
Comparison: Previous exam(s).

CLINICAL DATA: Patient returns today to evaluate RIGHT breast
calcifications identified on a recent screening mammogram

EXAM:
DIGITAL DIAGNOSTIC RIGHT MAMMOGRAM WITH CAD

[R ML (1 of 2)]
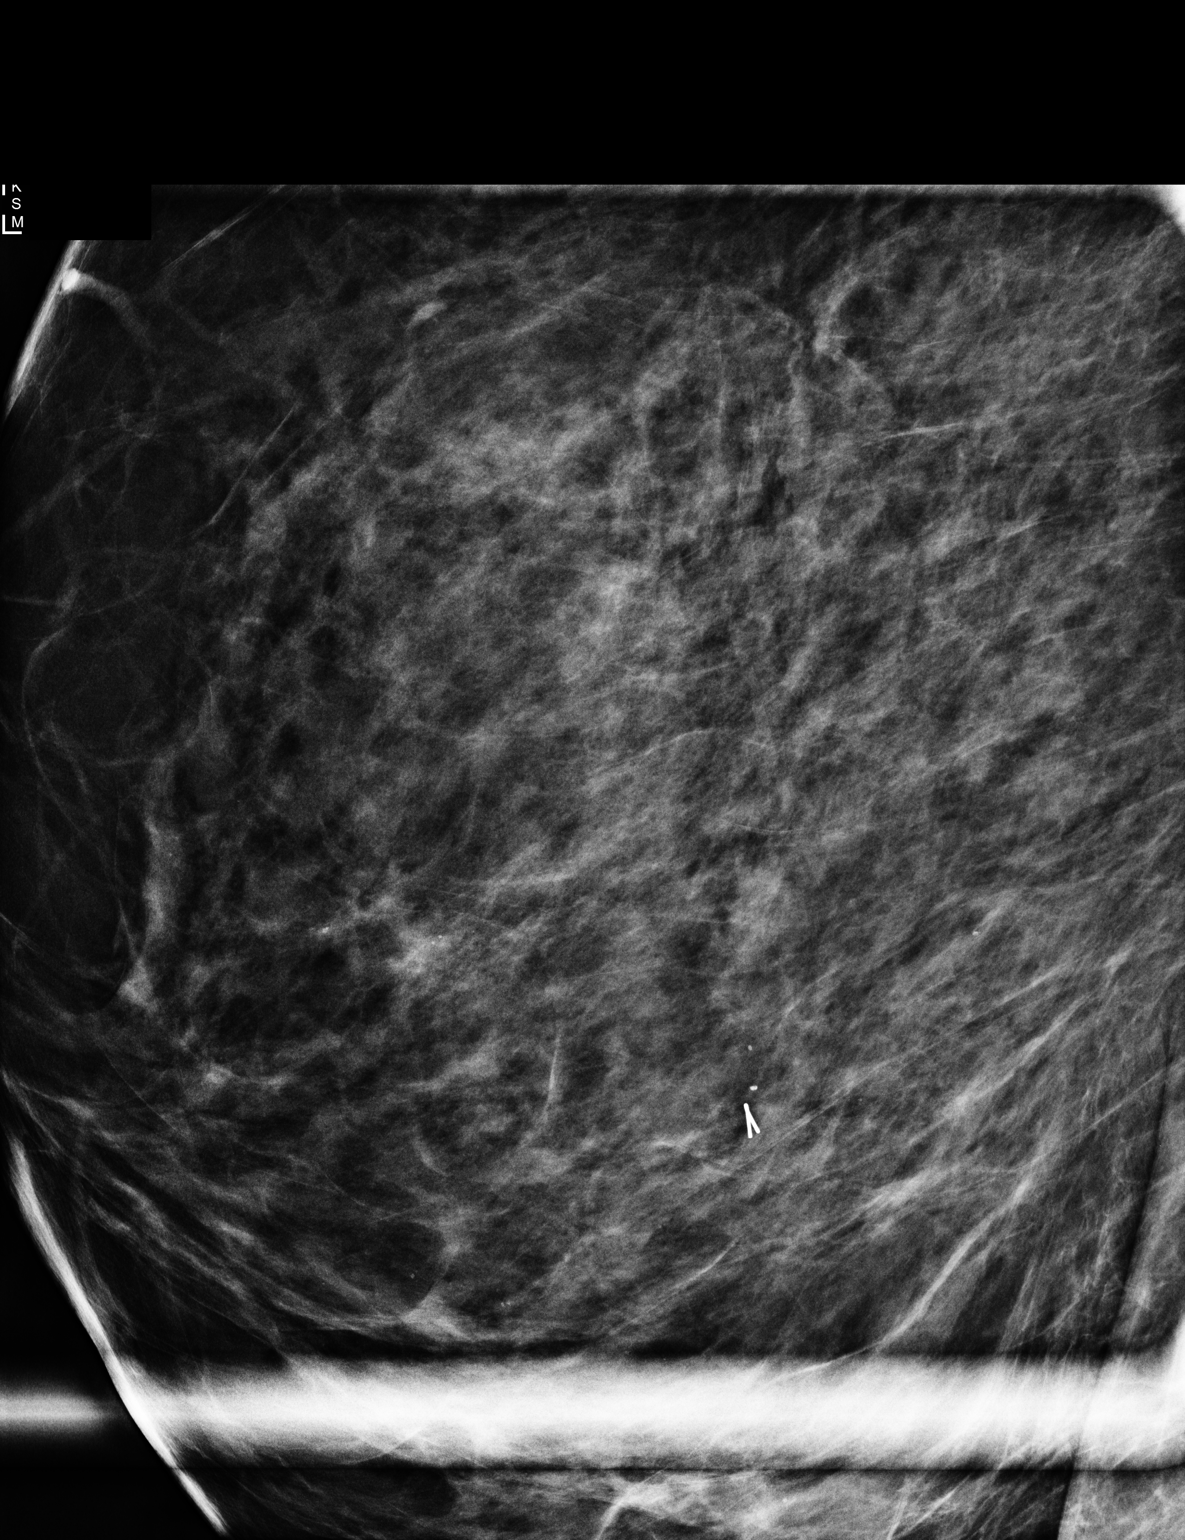

[R ML (2 of 2)]
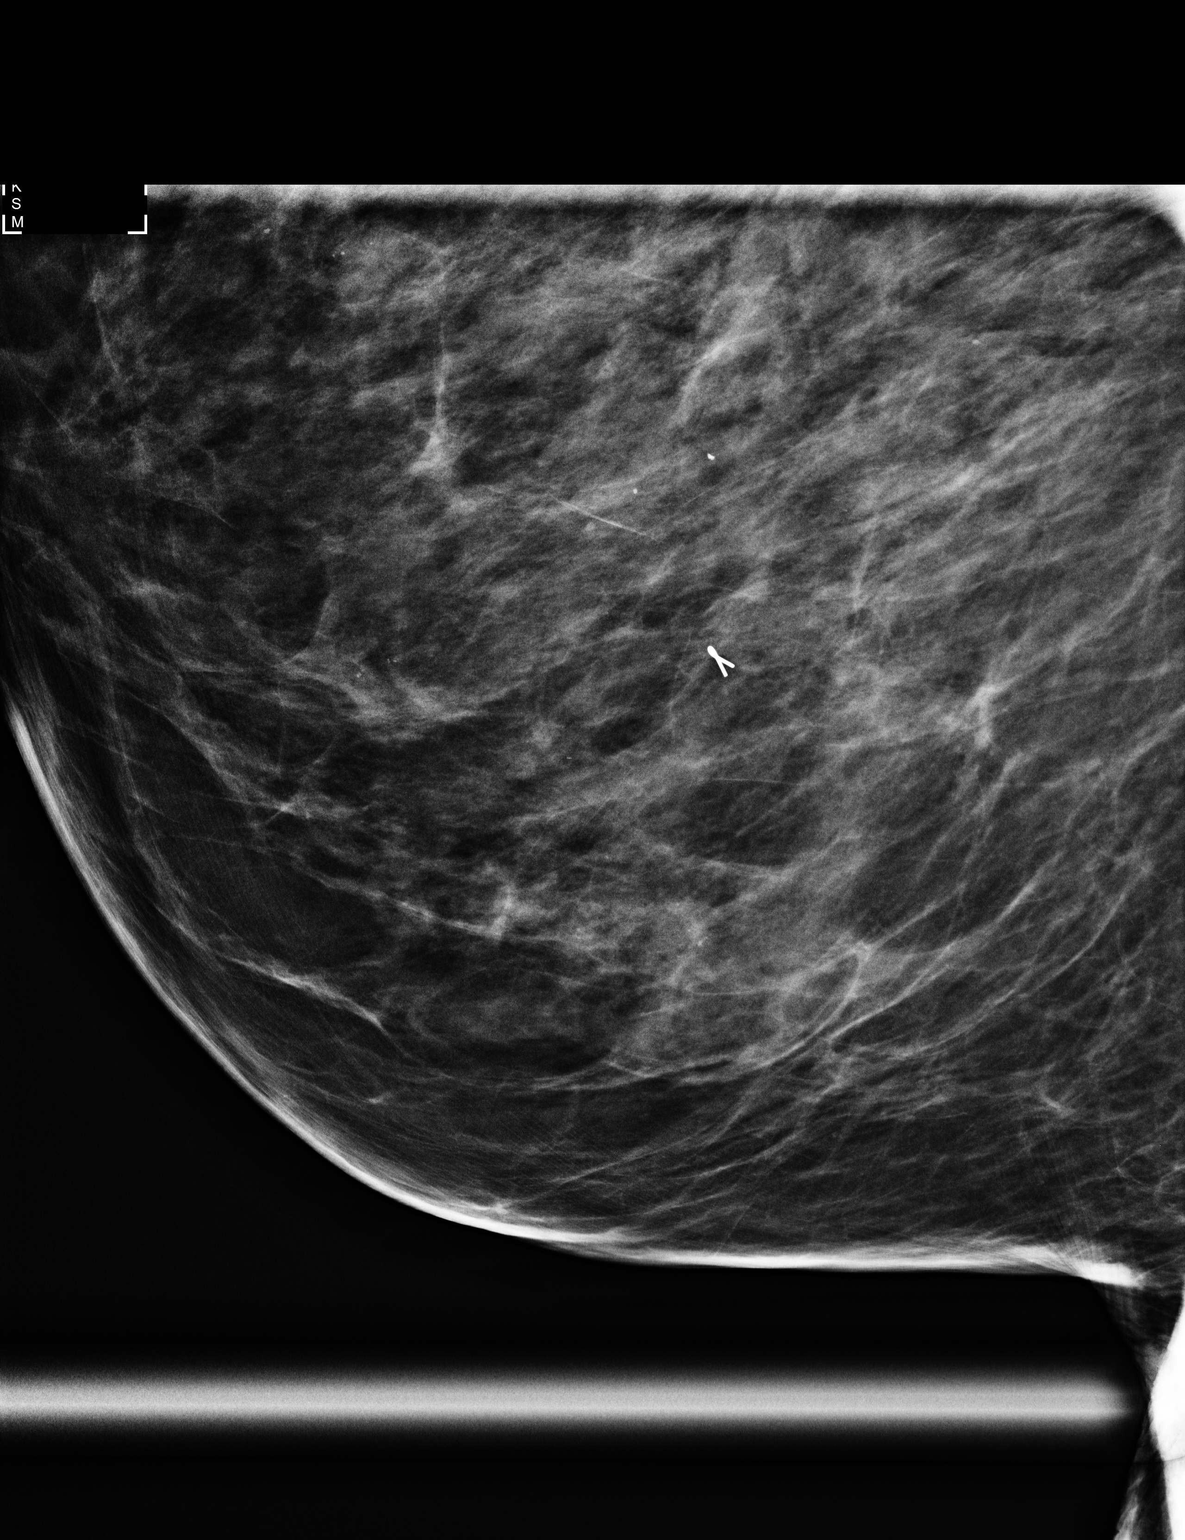

[R CC]
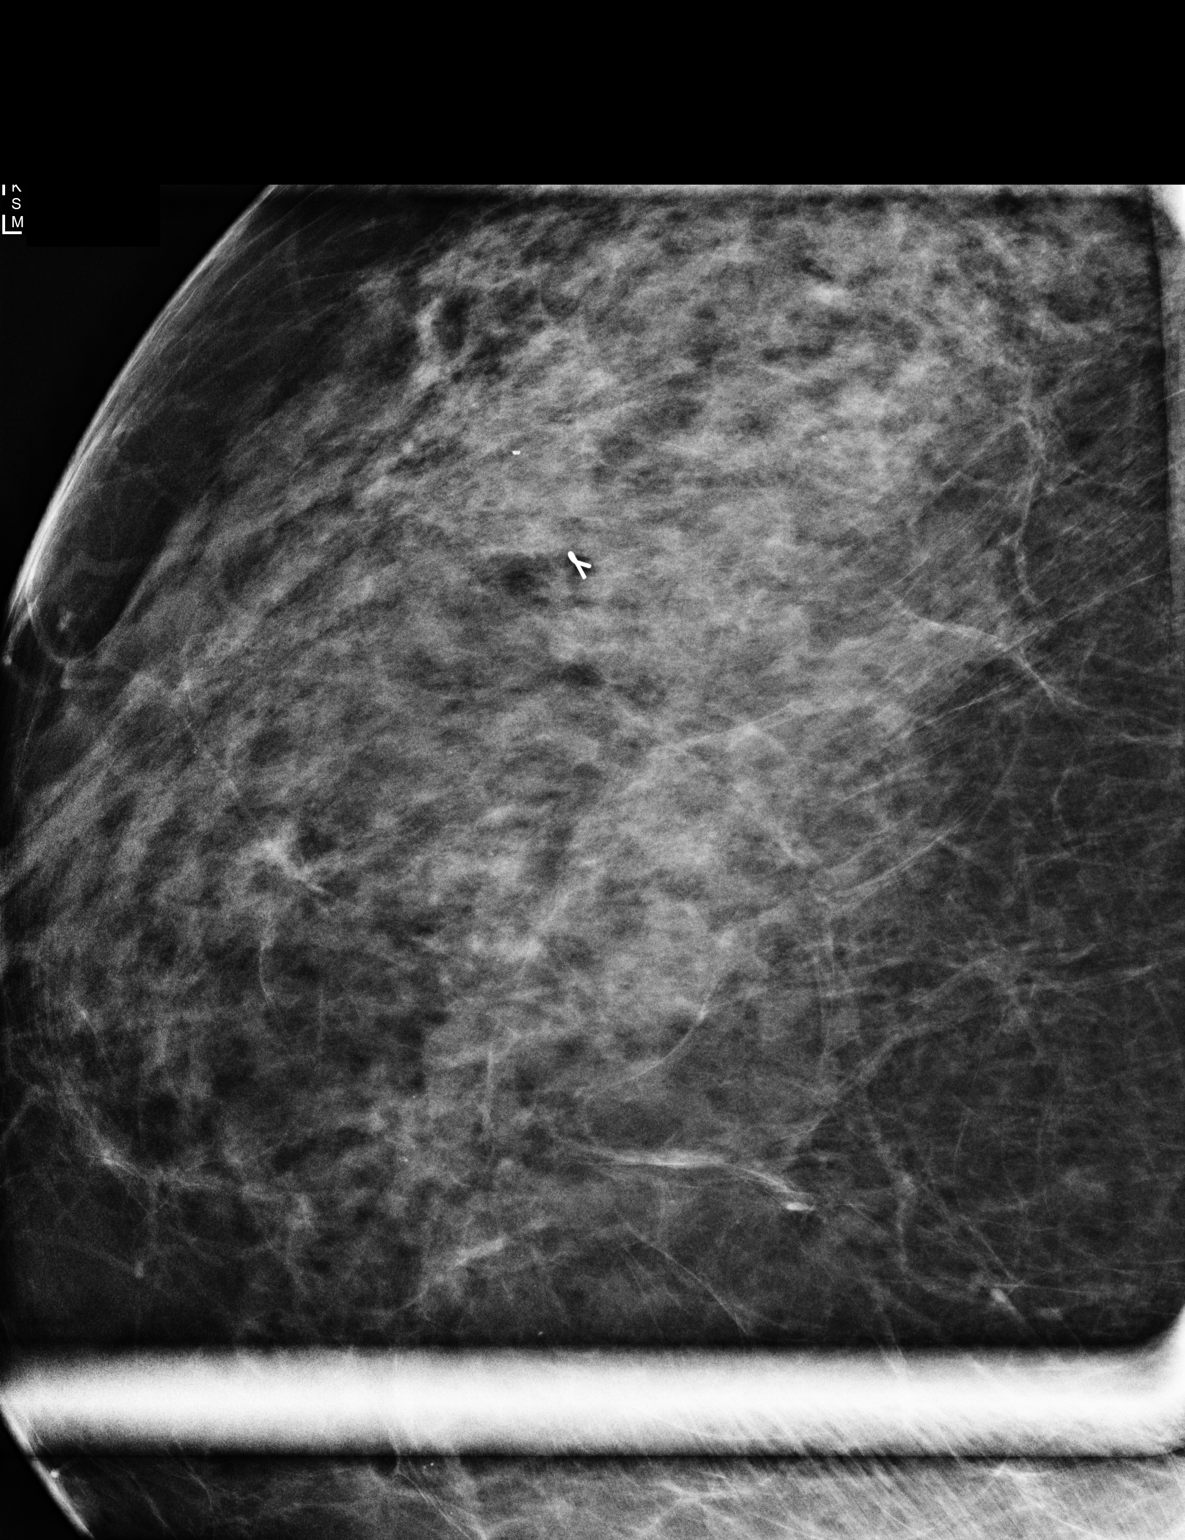

[3 of 3 positions shown; findings below may reference images not displayed]

ACR Breast Density Category c: The breast tissue is heterogeneously
dense, which may obscure small masses.
FINDINGS: On today's additional diagnostic views of the RIGHT breast with
magnification, scattered benign-appearing calcifications are
confirmed. There are no suspicious calcifications.

Mammographic images were processed with CAD.
IMPRESSION: No evidence of malignancy. Patient may return to routine annual
bilateral screening mammogram schedule.

RECOMMENDATION:
Screening mammogram in one year.(Code:[id number])

I have discussed the findings and recommendations with the patient.
Results were also provided in writing at the conclusion of the
visit. If applicable, a reminder letter will be sent to the patient
regarding the next appointment.

BI-RADS CATEGORY  2: Benign.

## 2018-11-13 ENCOUNTER — Ambulatory Visit: Payer: BLUE CROSS/BLUE SHIELD | Admitting: Cardiology

## 2019-02-13 ENCOUNTER — Ambulatory Visit: Payer: BLUE CROSS/BLUE SHIELD | Admitting: Cardiology

## 2019-03-22 NOTE — Telephone Encounter (Signed)
Spoke with Dr. Radford Pax, the patient would need to get the refill from Dr. Saunders Revel, until we see the patient. The patient understood and had no further questions.

## 2019-04-22 ENCOUNTER — Other Ambulatory Visit: Payer: Self-pay

## 2019-04-22 MED ORDER — PRAVASTATIN SODIUM 20 MG PO TABS: 10.0000 mg | ORAL_TABLET | Freq: Every evening | ORAL | 3 refills | Status: DC

## 2019-04-22 NOTE — Progress Notes (Signed)
Pravastatin ordered at pt request per Dr. Saunders Revel ok on last phone encounter.

## 2019-06-17 ENCOUNTER — Telehealth: Payer: Self-pay

## 2019-06-17 NOTE — Telephone Encounter (Signed)
Called and offered patient in office visit with Dr Radford Pax for upcoming appointment but she stated she would rather stay virtual because of Covid. I explained that is fine and that she does not come into office and we would call her to do her appointment. She stated her understanding.

## 2019-06-19 ENCOUNTER — Telehealth: Payer: Self-pay | Admitting: Cardiology

## 2019-06-19 ENCOUNTER — Encounter: Payer: Self-pay | Admitting: Cardiology

## 2019-06-19 DIAGNOSIS — I6529 Occlusion and stenosis of unspecified carotid artery: Secondary | ICD-10-CM

## 2019-06-19 HISTORY — DX: Occlusion and stenosis of unspecified carotid artery: I65.29

## 2019-06-19 NOTE — Telephone Encounter (Signed)

## 2019-06-19 NOTE — Telephone Encounter (Signed)
New Message ° ° ° °Left message to confirm appt and get consent  °

## 2019-06-19 NOTE — Progress Notes (Signed)
Virtual Visit via Video Note   This visit type was conducted due to national recommendations for restrictions regarding the COVID-19 Pandemic (e.g. social distancing) in an effort to limit this patient's exposure and mitigate transmission in our community.  Due to her co-morbid illnesses, this patient is at least at moderate risk for complications without adequate follow up.  This format is felt to be most appropriate for this patient at this time.  All issues noted in this document were discussed and addressed.  A limited physical exam was performed with this format.  Please refer to the patient's chart for her consent to telehealth for [facility].   Evaluation Performed:  Follow-up visit  This visit type was conducted due to national recommendations for restrictions regarding the COVID-19 Pandemic (e.g. social distancing).  This format is felt to be most appropriate for this patient at this time.  All issues noted in this document were discussed and addressed.  No physical exam was performed (except for noted visual exam findings with Video Visits).  Please refer to the patient's chart (MyChart message for video visits and phone note for telephone visits) for the patient's consent to telehealth for [facility].  Date:  06/20/2019   ID:  Amber Nunez, DOB Jan 24, 1951, MRN [id number]  Patient Location:  Home  Provider location:   [place]  PCP:  Martin[place], Betty G, MD  Cardiologist:  Fransico Him, MD Electrophysiologist:  None   Chief Complaint:  palpitations  History of Present Illness:    Amber Nunez is a 68 y.o. female who presents via audio/video conferencing for a telehealth visit today.    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  She was last seen by Dr. Saunders Revel last fall for palpitations and event monitor showed no  arrhythmias.  She was found to be bradycardic in the 40's after titrating her BB on her own.  She was still complaining of an electrical feeling in her chest that lasted seconds.  She was also concerned that her statin was causing bradycardia and and this was held.  TSH was normal.  She is now back on her statin.    She is here today for followup and is doing well.  She denies any chest pain or pressure, SOB, DOE, PND, orthopnea, LE edema,  palpitations or syncope. For the most part her HR is in the 50-60bpm range but occasionally at rest will get as low as 46bpm.  She will feel a little dizzy sometimes when her HR is in the 40's but she gets up and walks and it passess.  This is very rare in frequency. She has not had any palpitations but still has the "electrical/vibrating" sensation in her chest that she had while wearing her heart monitor which did not show anything.  She is compliant with her meds and is tolerating meds with no SE.    The patient does not have symptoms concerning for COVID-19 infection (fever, chills, cough, or new shortness of breath).    Prior CV studies:   The following studies were reviewed today:  none  Past Medical History:  Diagnosis Date  . Abnormal electrocardiogram (ECG) (EKG)   . Bilateral carotid artery stenosis   . Carotid artery stenosis 06/19/2019   < 50% bilateral stenosis on dopplers 2018  . Chest pain   .  Essential hypertension, benign   . Familial hypercholesterolemia   . Hx of dizziness   . Hx of syncope   . Palpitations   . Sinus bradycardia   . SVT (supraventricular tachycardia) ([place])   . Wolff-Parkinson-White (WPW) syndrome    Past Surgical History:  Procedure Laterality Date  . ABLATION  2000   had 2 for WPW  . BREAST BIOPSY Right    negative  . BREAST SURGERY     BIOPSY, 3 YEARS AGO  . MOLE REMOVAL    . OVARIES REMOVED       Current Meds  Medication Sig  . aspirin 81 MG tablet Take 81 mg by mouth daily.  Marland Kitchen atenolol (TENORMIN) 25  MG tablet Take 0.5 tablets (12.5 mg total) by mouth daily.  . Biotin 10000 MCG TABS Take 10,000 mcg by mouth daily.   . calcium carbonate (OS-CAL) 600 MG tablet Take 600 mg by mouth daily.   . clobetasol cream (TEMOVATE) 6.19 % Apply 1 application topically as needed. Apply one application of cream 5.09% to chest daily as needed for rash.  . Multiple Vitamin (MULTIVITAMIN WITH MINERALS) TABS tablet Take 1 tablet by mouth daily.  . multivitamin-lutein (OCUVITE-LUTEIN) CAPS capsule Take 1 capsule by mouth daily.  . pravastatin (PRAVACHOL) 20 MG tablet Take 0.5 tablets (10 mg total) by mouth every evening.  . tacrolimus (PROTOPIC) 0.03 % ointment Apply topically as needed.  . vitamin C (ASCORBIC ACID) 500 MG tablet Take 500 mg by mouth daily.     Allergies:   Penicillins, Atorvastatin, Lortab [hydrocodone-acetaminophen], and Zebeta [bisoprolol fumarate]   Social History   Tobacco Use  . Smoking status: Former Smoker    Packs/day: 0.50    Years: 10.00    Pack years: 5.00    Types: Cigarettes    Quit date: 1990    Years since quitting: 30.5  . Smokeless tobacco: Never Used  Substance Use Topics  . Alcohol use: Yes    Comment: One drink per month  . Drug use: No     Family Hx: The patient's family history includes Cancer in her father; Early death in her father; Heart attack (age of onset: 85) in her father; Heart disease in her father and mother; Hyperlipidemia in her brother and father. There is no history of Breast cancer.  ROS:   Please see the history of present illness.     All other systems reviewed and are negative.   Labs/Other Tests and Data Reviewed:    Recent Labs: 09/06/2018: TSH 1.440   Recent Lipid Panel Lab Results  Component Value Date/Time   CHOL 190 01/09/2018 07:57 AM   TRIG 84.0 01/09/2018 07:57 AM   HDL 70.20 01/09/2018 07:57 AM   CHOLHDL 3 01/09/2018 07:57 AM   LDLCALC 103 (H) 01/09/2018 07:57 AM    Wt Readings from Last 3 Encounters:  06/20/19  123 lb (55.8 kg)  09/06/18 123 lb (55.8 kg)  06/04/18 127 lb (57.6 kg)     Objective:    Vital Signs:  BP 125/73   Pulse 64   Temp 98.2 F (36.8 C)   Ht 5\' 2"  (1.575 m)   Wt 123 lb (55.8 kg)   BMI 22.50 kg/m    CONSTITUTIONAL:  Well nourished, well developed female in no acute distress.  EYES: anicteric MOUTH: oral mucosa is pink RESPIRATORY: Normal respiratory effort, symmetric expansion CARDIOVASCULAR: No peripheral edema SKIN: No rash, lesions or ulcers MUSCULOSKELETAL: no digital cyanosis NEURO: Cranial Nerves II-XII grossly  intact, moves all extremities PSYCH: Intact judgement and insight.  A&O x 3, Mood/affect appropriate   ASSESSMENT & PLAN:    1.  Palpitations  - event monitor showed no arrhythmias.  She says that it really was not palpitations but was an electrical sensation like her chest was vibrating.  She has taken BB for suppression of subjective palpitations.  2.  Bradycardia - her TSH was normal.  Her statin was stopped for a while because she was concerned that the statin caused her low HR.  She is not back on the statin.  She says that at night it will go down to 46-50bpm and may occasionally feel dizzy with it but if she gets up and walks is resolves.  I have recommended that we continue her Atenolol for now but if episodes start to occur where HR is dropping while she is active or dizziness becomes more frequent we may have to stop her Atenolol.  She will let me know if this happens.   3.  Hypertension - her BP is controlled on exam.  She will continue on atenolol 12.5mg  daily.  4.  Hyperlipidemia - her LDL goal is < 70 due to carotid dz.  Her last LDL was 103 in January.  I will repeat FLP and ALT.  She will continue on Pravastatin 10mg  daily.   5.  Carotid artery stenosis - dopplers in 2018 showed < 50% bilateral carotid stenosis.  I will repeat dopplers to make sure this is stable. She will continue on ASA and statin.   6.  WPW with accessory pathway -  s/p ablation x 2 in 2013 and 2014.  Her last event monitor showed no arrhythmias and no pre excitiation on EKG.   COVID-19 Education: The signs and symptoms of COVID-19 were discussed with the patient and how to seek care for testing (follow up with PCP or arrange E-visit).  The importance of social distancing was discussed today.  Patient Risk:   After full review of this patient's clinical status, I feel that they are at least moderate risk at this time.  Time:   Today, I have spent 20 minutes on telemedicine discussing medical problems including bradycardia, HTN, palpitations, carotis stenosis, WPW.  We also reviewed the symptoms of COVID 19 and the ways to protect against contracting the virus with telehealth technology.  I spent an additional 5 minutes reviewing patient's chart including carotid dopplers, labs.  Medication Adjustments/Labs and Tests Ordered: Current medicines are reviewed at length with the patient today.  Concerns regarding medicines are outlined above.  Tests Ordered: No orders of the defined types were placed in this encounter.  Medication Changes: No orders of the defined types were placed in this encounter.   Disposition:  Follow up in 1 year(s)  Signed, Fransico Him, MD  06/20/2019 10:10 AM    [facility]

## 2019-06-20 ENCOUNTER — Telehealth (INDEPENDENT_AMBULATORY_CARE_PROVIDER_SITE_OTHER): Payer: Medicare HMO | Admitting: Cardiology

## 2019-06-20 ENCOUNTER — Encounter: Payer: Self-pay | Admitting: Cardiology

## 2019-06-20 ENCOUNTER — Other Ambulatory Visit: Payer: Self-pay

## 2019-06-20 VITALS — BP 125/73 | HR 64 | Temp 98.2°F | Ht 62.0 in | Wt 123.0 lb

## 2019-06-20 DIAGNOSIS — R001 Bradycardia, unspecified: Secondary | ICD-10-CM

## 2019-06-20 DIAGNOSIS — I6523 Occlusion and stenosis of bilateral carotid arteries: Secondary | ICD-10-CM | POA: Diagnosis not present

## 2019-06-20 DIAGNOSIS — R002 Palpitations: Secondary | ICD-10-CM | POA: Diagnosis not present

## 2019-06-20 DIAGNOSIS — E7800 Pure hypercholesterolemia, unspecified: Secondary | ICD-10-CM | POA: Diagnosis not present

## 2019-06-20 DIAGNOSIS — I456 Pre-excitation syndrome: Secondary | ICD-10-CM | POA: Diagnosis not present

## 2019-06-20 NOTE — Patient Instructions (Addendum)
Medication Instructions:  Your physician recommends that you continue on your current medications as directed. Please refer to the Current Medication list given to you today.  If you need a refill on your cardiac medications before your next appointment, please call your pharmacy.   Lab work: Fasting Labs: Lipid and Liver: 06/27/19  If you have labs (blood work) drawn today and your tests are completely normal, you will receive your results only by: Marland Kitchen MyChart Message (if you have MyChart) OR . A paper copy in the mail If you have any lab test that is abnormal or we need to change your treatment, we will call you to review the results.  Testing/Procedures: Your physician has requested that you have a carotid duplex. This test is an ultrasound of the carotid arteries in your neck. It looks at blood flow through these arteries that supply the brain with blood. Allow one hour for this exam. There are no restrictions or special instructions.  Follow-Up: At [facility], you and your health needs are our priority.  As part of our continuing mission to provide you with exceptional heart care, we have created designated Provider Care Teams.  These Care Teams include your primary Cardiologist (physician) and Advanced Practice Providers (APPs -  Physician Assistants and Nurse Practitioners) who all work together to provide you with the care you need, when you need it. You will need a follow up appointment in 1 years.  Please call our office 2 months in advance to schedule this appointment.  You may see Dr. Radford Pax or one of the following Advanced Practice Providers on your designated Care Team:   Orr, PA-C Melina Copa, PA-C . Ermalinda Barrios, PA-C

## 2019-06-24 ENCOUNTER — Encounter: Payer: Self-pay | Admitting: Cardiology

## 2019-06-24 NOTE — Telephone Encounter (Signed)
See MyChart message

## 2019-06-24 NOTE — Telephone Encounter (Signed)
New Message    Patient states someone called her twice.  I don't see in the chart who called her.  Patient was upset about call in hold times would someone please call her back.

## 2019-06-24 NOTE — Telephone Encounter (Signed)
See MY CHART from 7/10.

## 2019-06-24 NOTE — Telephone Encounter (Addendum)
Spoke with the patient, she accepted stopping Atenolol and will call if she had any problems.

## 2019-06-24 NOTE — Telephone Encounter (Signed)
Spoke with the patient she got her testing scheduled, however, she wanted to report she has had low heart rates frequently around 46-52 BPM. She wanted to know if she could stop her atenolol. Her BP today was 124/72.

## 2019-06-27 ENCOUNTER — Other Ambulatory Visit: Payer: Medicare HMO | Admitting: *Deleted

## 2019-06-27 ENCOUNTER — Other Ambulatory Visit: Payer: Self-pay

## 2019-06-27 DIAGNOSIS — E7800 Pure hypercholesterolemia, unspecified: Secondary | ICD-10-CM | POA: Diagnosis not present

## 2019-06-27 LAB — LIPID PANEL
Chol/HDL Ratio: 2.3 ratio (ref 0.0–4.4)
Cholesterol, Total: 197 mg/dL (ref 100–199)
HDL: 87 mg/dL (ref >39)
LDL Calculated: 95 mg/dL (ref 0–99)
Triglycerides: 74 mg/dL (ref 0–149)
VLDL Cholesterol Cal: 15 mg/dL (ref 5–40)

## 2019-06-27 LAB — HEPATIC FUNCTION PANEL
ALT: 14 IU/L (ref 0–32)
AST: 27 IU/L (ref 0–40)
Albumin: 4.8 g/dL (ref 3.8–4.8)
Alkaline Phosphatase: 76 IU/L (ref 39–117)
Bilirubin Total: 0.7 mg/dL (ref 0.0–1.2)
Bilirubin, Direct: 0.18 mg/dL (ref 0.00–0.40)
Total Protein: 7.2 g/dL (ref 6.0–8.5)

## 2019-07-01 ENCOUNTER — Telehealth: Payer: Self-pay | Admitting: *Deleted

## 2019-07-01 DIAGNOSIS — I6523 Occlusion and stenosis of bilateral carotid arteries: Secondary | ICD-10-CM

## 2019-07-01 DIAGNOSIS — E7800 Pure hypercholesterolemia, unspecified: Secondary | ICD-10-CM

## 2019-07-01 DIAGNOSIS — E785 Hyperlipidemia, unspecified: Secondary | ICD-10-CM

## 2019-07-01 MED ORDER — PRAVASTATIN SODIUM 20 MG PO TABS: 20.0000 mg | ORAL_TABLET | Freq: Every evening | ORAL | 1 refills | Status: DC

## 2019-07-01 NOTE — Telephone Encounter (Signed)
Spoke with the pt and informed her that per Dr Radford Pax, her LDL is not at goal, and she recommends that we increase her Pravastatin 20 mg po daily, and repeat fasting lipids and ALT in 6 weeks. Confirmed the pharmacy of choice with the pt and note placed to her pharmacy that she will call for further refills, for she will use her current supply on-hand first.  Scheduled the pt for labs in 6 weeks on 08/15/2019 at 1045.  Pt is aware to come fasting.  Pt verbalized understanding and agrees with this plan.

## 2019-07-01 NOTE — Telephone Encounter (Signed)
-----   Message from Sarina Ill, RN sent at 06/28/2019  5:12 PM EDT -----  ----- Message ----- From: Sueanne Margarita, MD Sent: 06/27/2019   3:43 PM EDT To: Betty G Martinique, MD, Sarina Ill, RN  LDL not at goal - increase Pravastatin to 20mg  daily and repeat FLp and ALT in 6 weeks

## 2019-07-04 ENCOUNTER — Other Ambulatory Visit: Payer: Self-pay

## 2019-07-04 ENCOUNTER — Ambulatory Visit (HOSPITAL_COMMUNITY)
Admission: RE | Admit: 2019-07-04 | Discharge: 2019-07-04 | Disposition: A | Discharge status: Home / Self Care | Payer: Medicare HMO | Source: Ambulatory Visit | Attending: Cardiology | Admitting: Cardiology

## 2019-07-04 DIAGNOSIS — I6523 Occlusion and stenosis of bilateral carotid arteries: Secondary | ICD-10-CM

## 2019-07-07 DIAGNOSIS — E785 Hyperlipidemia, unspecified: Secondary | ICD-10-CM

## 2019-07-07 DIAGNOSIS — I6523 Occlusion and stenosis of bilateral carotid arteries: Secondary | ICD-10-CM

## 2019-07-07 DIAGNOSIS — E7800 Pure hypercholesterolemia, unspecified: Secondary | ICD-10-CM

## 2019-07-09 DIAGNOSIS — I6523 Occlusion and stenosis of bilateral carotid arteries: Secondary | ICD-10-CM

## 2019-07-09 DIAGNOSIS — E785 Hyperlipidemia, unspecified: Secondary | ICD-10-CM

## 2019-07-09 DIAGNOSIS — E7800 Pure hypercholesterolemia, unspecified: Secondary | ICD-10-CM

## 2019-07-09 MED ORDER — PRAVASTATIN SODIUM 20 MG PO TABS: 20.0000 mg | ORAL_TABLET | Freq: Every evening | ORAL | 3 refills | Status: DC

## 2019-07-09 NOTE — Telephone Encounter (Signed)
Pt's medication was sent to pt's pharmacy as requested. Confirmation received.  °

## 2019-07-10 MED ORDER — PRAVASTATIN SODIUM 20 MG PO TABS: 20.0000 mg | ORAL_TABLET | Freq: Every evening | ORAL | 3 refills | Status: DC

## 2019-07-10 NOTE — Telephone Encounter (Signed)
Pt's medication was resent to the correct pharmacy as requested by pt. Confirmation received.  °

## 2019-07-22 ENCOUNTER — Other Ambulatory Visit: Payer: Self-pay

## 2019-07-22 DIAGNOSIS — I6523 Occlusion and stenosis of bilateral carotid arteries: Secondary | ICD-10-CM

## 2019-07-22 DIAGNOSIS — E785 Hyperlipidemia, unspecified: Secondary | ICD-10-CM

## 2019-07-22 DIAGNOSIS — E7800 Pure hypercholesterolemia, unspecified: Secondary | ICD-10-CM

## 2019-07-22 MED ORDER — PRAVASTATIN SODIUM 20 MG PO TABS: 20.0000 mg | ORAL_TABLET | Freq: Every evening | ORAL | 0 refills | Status: DC

## 2019-07-22 NOTE — Progress Notes (Signed)
Sent in 7 day supply of pravastatin to [facility] as pts Humma shipment was delayed and she is currently out of pills.

## 2019-08-15 ENCOUNTER — Other Ambulatory Visit: Payer: Medicare HMO | Admitting: *Deleted

## 2019-08-15 ENCOUNTER — Other Ambulatory Visit: Payer: Self-pay

## 2019-08-15 DIAGNOSIS — I6523 Occlusion and stenosis of bilateral carotid arteries: Secondary | ICD-10-CM

## 2019-08-15 DIAGNOSIS — E785 Hyperlipidemia, unspecified: Secondary | ICD-10-CM | POA: Diagnosis not present

## 2019-08-15 DIAGNOSIS — E7800 Pure hypercholesterolemia, unspecified: Secondary | ICD-10-CM

## 2019-08-15 LAB — LIPID PANEL
Chol/HDL Ratio: 2.4 ratio (ref 0.0–4.4)
Cholesterol, Total: 200 mg/dL — ABNORMAL HIGH (ref 100–199)
HDL: 85 mg/dL (ref >39)
LDL Chol Calc (NIH): 103 mg/dL — ABNORMAL HIGH (ref 0–99)
Triglycerides: 69 mg/dL (ref 0–149)
VLDL Cholesterol Cal: 12 mg/dL (ref 5–40)

## 2019-08-15 LAB — ALT: ALT: 12 IU/L (ref 0–32)

## 2019-08-16 ENCOUNTER — Telehealth: Payer: Self-pay

## 2019-08-16 DIAGNOSIS — E785 Hyperlipidemia, unspecified: Secondary | ICD-10-CM

## 2019-08-16 DIAGNOSIS — E7800 Pure hypercholesterolemia, unspecified: Secondary | ICD-10-CM

## 2019-08-16 NOTE — Telephone Encounter (Signed)
Refer to lipid clinic 

## 2019-08-16 NOTE — Telephone Encounter (Signed)
-----   Message from Sueanne Margarita, MD sent at 08/15/2019 11:02 PM EDT ----- LDL still not at goal - increase pravastatin to 40mg  daily and repeat FLP and ALT in 6 weeks

## 2019-08-16 NOTE — Telephone Encounter (Signed)
Notes recorded by Frederik Schmidt, RN on 08/16/2019 at 8:28 AM EDT  The patient has been notified of the result and verbalized understanding. All questions (if any) were answered.  Frederik Schmidt, RN 08/16/2019 8:28 AM   The patient is reluctant to increase Pravastatin to 40 mg daily.  She is concerned because she has developed a rash from the medication and is having vision problems.  She would like some other recommendation.  She has been very conscious of diet and can't understand values.  Please advise, thank you

## 2019-08-20 ENCOUNTER — Other Ambulatory Visit: Payer: Self-pay

## 2019-08-20 NOTE — Telephone Encounter (Signed)
Pt agrees to be treated by the Lipid Clinic.

## 2019-08-20 NOTE — Addendum Note (Signed)
Addended by: Stephani Police on: 08/20/2019 08:56 AM   Modules accepted: Orders

## 2019-08-22 DIAGNOSIS — H353131 Nonexudative age-related macular degeneration, bilateral, early dry stage: Secondary | ICD-10-CM | POA: Diagnosis not present

## 2019-09-12 ENCOUNTER — Ambulatory Visit (INDEPENDENT_AMBULATORY_CARE_PROVIDER_SITE_OTHER): Payer: Medicare HMO | Admitting: Pharmacist

## 2019-09-12 ENCOUNTER — Other Ambulatory Visit: Payer: Self-pay

## 2019-09-12 DIAGNOSIS — E7800 Pure hypercholesterolemia, unspecified: Secondary | ICD-10-CM | POA: Diagnosis not present

## 2019-09-12 MED ORDER — ROSUVASTATIN CALCIUM 10 MG PO TABS: 10.0000 mg | ORAL_TABLET | Freq: Every day | ORAL | 11 refills | Status: DC

## 2019-09-12 NOTE — Patient Instructions (Addendum)
It was nice to meet you today  Your LDL is currently 103 and your goal is < 70  Stop taking pravastatin and start taking rosuvastatin 10mg  once daily  Call Malaika Arnall, Pharmacist with any side effects [phone]  Recheck fasting labs in 2-3 months on Thursday, January 7th any time after 7:30am

## 2019-09-12 NOTE — Progress Notes (Signed)
Patient ID: Amber Nunez                 DOB: 19-Jan-1951                    MRN: [id number]     HPI: Amber Nunez is a 68 y.o. female patient referred to lipid clinic by Dr Radford Pax. PMH is significant for < 50% bilateral carotid artery stenosis on 2018 dopplers, atypical chest pain with negative myoview in 2018, HTN, HLD, and WPW s/p accessory pathway ablation x2 in 2013 and 2014. Lipids were checked last month and LDL was above goal < 70 at 103. Pt was advised to increase her pravastatin from 20mg  to 40mg  daily, however she was reluctant to do so due to rash and vision problems that she attributed to the statin. She presents to lipid clinic for further management.  Patient presents today in good spirits. She is originally from [place], [place] and moved to [place] 2 years ago. She is currently taking pravastatin 10mg  daily. Experienced blurred vision, some constipation and a rash on her chest and face with the 20mg  dose. She previously experienced a cough on atorvastatin. Has not tried any other lipid lowering therapy. She has lost 15 lbs in the past 2 years. Stays active on her feet at work. Has been making improvements in her diet to decrease saturated fat intake.  Current Medications: pravastatin 10mg  daily Intolerances: atorvastatin - cough, pravastatin 20mg  - blurred vision, constipation, rash Risk Factors: carotid artery stenosis LDL goal: 70mg /dL  Diet: Cutting back on dairy, ate more salads and also more pork over the summer. Limits fried and fast food.  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 There is no history of Breast cancer.  Social History: Former smoker 1/2 PPD for 10 years, quit in 1990, rare alcohol use, denies illicit  drug use.  Labs: 08/15/19: TC 200, TG 69, HDL 85, LDL 103 (pravastatin 20mg  daily)  Past Medical History:  Diagnosis Date  . Abnormal electrocardiogram (ECG) (EKG)   . Bilateral carotid artery stenosis   . Carotid artery stenosis 06/19/2019   < 50% bilateral stenosis on dopplers 2018  . Chest pain   . Essential hypertension, benign   . Familial hypercholesterolemia   . Hx of dizziness   . Hx of syncope   . Palpitations   . Sinus bradycardia   . SVT (supraventricular tachycardia) ([facility])   . Wolff-Parkinson-White (WPW) syndrome     Current Outpatient Medications on File Prior to Visit  Medication Sig Dispense Refill  . aspirin 81 MG tablet Take 81 mg by mouth daily.    . Biotin 10000 MCG TABS Take 10,000 mcg by mouth daily.     . calcium carbonate (OS-CAL) 600 MG tablet Take 600 mg by mouth daily.     . clobetasol cream (TEMOVATE) [id number] % Apply 1 application topically as needed. Apply one application of cream [id number] to chest daily as needed for rash.    . Multiple Vitamin (MULTIVITAMIN WITH MINERALS) TABS tablet Take 1 tablet by mouth daily.    . multivitamin-lutein (OCUVITE-LUTEIN) CAPS capsule Take 1 capsule by mouth daily.    . pravastatin (PRAVACHOL) 20 MG tablet Take 1 tablet (20 mg total)  by mouth every evening for 7 days. 7 tablet 0  . tacrolimus (PROTOPIC) 0.03 % ointment Apply topically as needed.    . vitamin C (ASCORBIC ACID) 500 MG tablet Take 500 mg by mouth daily.     No current facility-administered medications on file prior to visit.     Allergies  Allergen Reactions  . Penicillins Nausea And Vomiting  . Atorvastatin     cough  . Lortab [Hydrocodone-Acetaminophen] Other (See Comments)    Passing out  . Zebeta [Bisoprolol Fumarate] Rash    Assessment/Plan:  1. Hyperlipidemia - LDL 103 on pravastatin 10mg  daily, above goal < 70 due to hx of ASCVD. Will stop pravastatin and start rosuvastatin 10mg  daily. Recheck lipids in 3 months. Advised pt to call clinic with  any side effects.   E. Supple, PharmD, BCACP, [place] [id number] [place], [place] [address] Phone: [phone]; Fax: [phone] 09/12/2019 11:02 AM

## 2019-09-24 ENCOUNTER — Telehealth: Payer: Self-pay | Admitting: Pharmacist

## 2019-09-24 MED ORDER — EZETIMIBE 10 MG PO TABS: 10.0000 mg | ORAL_TABLET | Freq: Every day | ORAL | 11 refills | Status: DC

## 2019-09-24 NOTE — Telephone Encounter (Signed)
Pt states she has taken 3 doses of rosuvastatin and reported a tingling/numbing feeling in her leg, GI upset, and small rash on her leg. She was off of the pravastatin for 5 days before starting the rosuvastatin. Will go back to pravastatin 10mg  daily (patient's max tolerated dose) and add ezetimibe 10mg  daily. Will keep lab appt scheduled in January, advised pt to call clinic with any issues. Can try Nexletol or [id number] if needed.

## 2019-10-13 ENCOUNTER — Encounter: Payer: Self-pay | Admitting: Family Medicine

## 2019-12-19 ENCOUNTER — Other Ambulatory Visit: Payer: Self-pay

## 2019-12-19 ENCOUNTER — Other Ambulatory Visit: Payer: Medicare HMO | Admitting: *Deleted

## 2019-12-19 DIAGNOSIS — E7800 Pure hypercholesterolemia, unspecified: Secondary | ICD-10-CM | POA: Diagnosis not present

## 2019-12-19 LAB — LIPID PANEL
Chol/HDL Ratio: 1.9 ratio (ref 0.0–4.4)
Cholesterol, Total: 169 mg/dL (ref 100–199)
HDL: 88 mg/dL (ref >39)
LDL Chol Calc (NIH): 70 mg/dL (ref 0–99)
Triglycerides: 54 mg/dL (ref 0–149)
VLDL Cholesterol Cal: 11 mg/dL (ref 5–40)

## 2019-12-19 LAB — HEPATIC FUNCTION PANEL
ALT: 20 IU/L (ref 0–32)
AST: 32 IU/L (ref 0–40)
Albumin: 4.7 g/dL (ref 3.8–4.8)
Alkaline Phosphatase: 86 IU/L (ref 39–117)
Bilirubin Total: 0.6 mg/dL (ref 0.0–1.2)
Bilirubin, Direct: 0.21 mg/dL (ref 0.00–0.40)
Total Protein: 7.4 g/dL (ref 6.0–8.5)

## 2019-12-24 DIAGNOSIS — H1045 Other chronic allergic conjunctivitis: Secondary | ICD-10-CM | POA: Diagnosis not present

## 2020-01-02 ENCOUNTER — Telehealth: Payer: Self-pay | Admitting: Cardiology

## 2020-01-02 NOTE — Telephone Encounter (Addendum)
Pt called to report that around 8 am she had a "dull ache" on the left side of her chest and it only lasted seconds but it came back 3-4 times only lasting a very short time not related to exertion. She took ASA 81 mg and it was better but it returned about 12 noon and lasted just a few seconds again.    She denies radiation to her arm, no nausea, cough, or dizziness.. no SOB. No exertional discomfort.   Pt is asking to see Dr. Radford Pax today since she is off. I advised her that I will send her message to her for review and recommendations. I asked her to relax and to continue to monitor her symptoms and to call if anything worsens.    ** Dr. Radford Pax has an appt open spot for 01/03/20 at 1:40pm

## 2020-01-02 NOTE — Telephone Encounter (Signed)
Pt c/o of Chest Pain: STAT if CP now or developed within 24 hours  1. Are you having CP right now? No  2. Are you experiencing any other symptoms (ex. SOB, nausea, vomiting, sweating)? No  3. How long have you been experiencing CP? About 3 hours as of 01/02/20 at 12:25 PM  4. Is your CP continuous or coming and going? Coming and going  5. Have you taken Nitroglycerin? NO ?

## 2020-01-02 NOTE — Telephone Encounter (Signed)
Spoke with the pt per Dr. Radford Pax and made her an appt for 01/03/20 at 1:40 pm.. pt to continue to monitor symptoms and to go to the ED if her symptoms worsen.

## 2020-01-03 ENCOUNTER — Encounter: Payer: Self-pay | Admitting: Cardiology

## 2020-01-03 ENCOUNTER — Ambulatory Visit: Payer: Medicare HMO | Admitting: Cardiology

## 2020-01-03 ENCOUNTER — Other Ambulatory Visit: Payer: Self-pay

## 2020-01-03 VITALS — BP 134/74 | HR 67 | Ht 62.0 in | Wt 118.2 lb

## 2020-01-03 DIAGNOSIS — I456 Pre-excitation syndrome: Secondary | ICD-10-CM

## 2020-01-03 DIAGNOSIS — E785 Hyperlipidemia, unspecified: Secondary | ICD-10-CM

## 2020-01-03 DIAGNOSIS — R079 Chest pain, unspecified: Secondary | ICD-10-CM

## 2020-01-03 DIAGNOSIS — I6523 Occlusion and stenosis of bilateral carotid arteries: Secondary | ICD-10-CM | POA: Diagnosis not present

## 2020-01-03 DIAGNOSIS — Z0100 Encounter for examination of eyes and vision without abnormal findings: Secondary | ICD-10-CM | POA: Diagnosis not present

## 2020-01-03 NOTE — Progress Notes (Signed)
Cardiology Office Note:    Date:  01/05/2020   ID:  Amber Nunez, DOB 1951-10-20, MRN [id number]  PCP:  [place], Betty G, MD  Cardiologist:  Fransico Him, MD    Referring MD: [place], Betty G, MD   Chief Complaint  Patient presents with  . Sleep Apnea    History of Present Illness:    Amber Nunez is a 69 y.o. 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  She was last seen by Dr. Saunders Revel last fall for palpitations and event monitor showed no arrhythmias.  She was found to be bradycardic in the 40's after titrating her BB on her own.  She was still complaining of an electrical feeling in her chest that lasted seconds.  She was also concerned that her statin was causing bradycardia and and this was held.  TSH was normal.  She is now back on her statin.    She is here today for evaluation of chest pain.  SHe called in 2 days ago complaining of chest pain.  She says that before she gets out of bed in the am she will do stretching exercises to help with a "pinched nerve" in her chest.  The other day she did her exercises and then got out of bed and developed a dull aching sensation in her left chest.  These only lasted 3-4 seconds and would resolved.  This occurred several times off and on throughout the day.  THere was no change with movement or deep breathing.  There was no associated nausea, diaphoresis or SOB.   It got better the next day and she has not had any further episodes until the CMA placed the EKG leads on her chest and pushed down on her chdest wall and it recreated the chest discomfort.  She has a remote hx of tobacco use but no fm hx of CAD.  Nuclear stress test in 2018 showed no ischemia.  Bilateral carotid dopplers in 2018 showed < 50% carotid stenosis bilaterally.    Past Medical History:  Diagnosis Date  . Abnormal electrocardiogram (ECG) (EKG)    . Carotid artery stenosis 06/19/2019   < 50% bilateral stenosis on dopplers 2018  . Essential hypertension, benign   . Familial hypercholesterolemia   . Hx of dizziness   . Hx of syncope   . Sinus bradycardia   . SVT (supraventricular tachycardia) ([place] [facility])   . Wolff-Parkinson-White (WPW) syndrome     Past Surgical History:  Procedure Laterality Date  . ABLATION  2000   had 2 for WPW  . BREAST BIOPSY Right    negative  . BREAST SURGERY     BIOPSY, 3 YEARS AGO  . MOLE REMOVAL    . OVARIES REMOVED      Current Medications: Current Meds  Medication Sig  . aspirin 81 MG tablet Take 81 mg by mouth daily.  . Biotin 10000 MCG TABS Take 10,000 mcg by mouth daily.   . calcium carbonate (OS-CAL) 600 MG tablet Take 600 mg by mouth daily.   . clobetasol cream (TEMOVATE) [id number] % Apply 1 application topically as needed. Apply one application of cream [id number] to chest daily as needed for rash.  . Multiple Vitamin (MULTIVITAMIN WITH MINERALS) TABS tablet Take 1 tablet by mouth daily.  . multivitamin-lutein (OCUVITE-LUTEIN) CAPS capsule Take 1 capsule by mouth daily.  Marland Kitchen  pravastatin (PRAVACHOL) 20 MG tablet Take 0.5 tablets (10 mg total) by mouth every evening.  . tacrolimus (PROTOPIC) 0.03 % ointment Apply topically as needed.  . vitamin C (ASCORBIC ACID) 500 MG tablet Take 500 mg by mouth daily.     Allergies:   Penicillins, Atorvastatin, Lortab [hydrocodone-acetaminophen], Pravastatin, and Zebeta [bisoprolol fumarate]   Social History   Socioeconomic History  . Marital status: Married    Spouse name: Not on file  . Number of children: Not on file  . Years of education: Not on file  . Highest education level: Not on file  Occupational History  . Not on file  Tobacco Use  . Smoking status: Former Smoker    Packs/day: 0.50    Years: 10.00    Pack years: 5.00    Types: Cigarettes    Quit date: 1990    Years since quitting: 31.0  . Smokeless tobacco: Never Used  Substance and Sexual  Activity  . Alcohol use: Yes    Comment: One drink per month  . Drug use: No  . Sexual activity: Never    Partners: Male    Comment: MARRIEDE  Other Topics Concern  . Not on file  Social History Narrative  . Not on file   Social Determinants of Health   Financial Resource Strain:   . Difficulty of Paying Living Expenses: Not on file  Food Insecurity:   . Worried About Charity fundraiser in the Last Year: Not on file  . Ran Out of Food in the Last Year: Not on file  Transportation Needs:   . Lack of Transportation (Medical): Not on file  . Lack of Transportation (Non-Medical): Not on file  Physical Activity:   . Days of Exercise per Week: Not on file  . Minutes of Exercise per Session: Not on file  Stress:   . Feeling of Stress : Not on file  Social Connections:   . Frequency of Communication with Friends and Family: Not on file  . Frequency of Social Gatherings with Friends and Family: Not on file  . Attends Religious Services: Not on file  . Active Member of Clubs or Organizations: Not on file  . Attends Archivist Meetings: Not on file  . Marital Status: Not on file     Family History: The patient's family history includes Cancer in her father; Early death in her father; Heart attack (age of onset: 64) in her father; Heart disease in her father and mother; Hyperlipidemia in her brother and father. There is no history of Breast cancer.  ROS:   Please see the history of present illness.    ROS  All other systems reviewed and negative.   EKGs/Labs/Other Studies Reviewed:    The following studies were reviewed today: PAP compliance download  EKG:  EKG is  ordered today.  The ekg ordered today demonstrates NSR with no ST changes  Recent Labs: 12/19/2019: ALT 20   Recent Lipid Panel    Component Value Date/Time   CHOL 169 12/19/2019 0924   TRIG 54 12/19/2019 0924   HDL 88 12/19/2019 0924   CHOLHDL 1.9 12/19/2019 0924   CHOLHDL 3 01/09/2018 0757    VLDL 16.8 01/09/2018 0757   LDLCALC 70 12/19/2019 0924    Physical Exam:    VS:  BP 134/74   Pulse 67   Ht 5\' 2"  (1.575 m)   Wt 118 lb 3.2 oz (53.6 kg)   SpO2 98%   BMI 21.62  kg/m     Wt Readings from Last 3 Encounters:  01/03/20 118 lb 3.2 oz (53.6 kg)  06/20/19 123 lb (55.8 kg)  09/06/18 123 lb (55.8 kg)     GEN:  Well nourished, well developed in no acute distress HEENT: Normal NECK: No JVD; No carotid bruits LYMPHATICS: No lymphadenopathy CARDIAC: RRR, no murmurs, rubs, gallops RESPIRATORY:  Clear to auscultation without rales, wheezing or rhonchi  ABDOMEN: Soft, non-tender, non-distended MUSCULOSKELETAL:  No edema; No deformity  SKIN: Warm and dry NEUROLOGIC:  Alert and oriented x 3 PSYCHIATRIC:  Normal affect   ASSESSMENT:    1. Chest pain of uncertain etiology   2. WPW (Wolff-Parkinson-White syndrome)   3. Bilateral carotid artery stenosis   4. Hyperlipidemia, unspecified hyperlipidemia type    PLAN:    In order of problems listed above:  1.  Atypical chest pain -this occurred after she did some stretching exercises in bed -suspect this is MSK pain as it was completely reproduced by pressing on chest wall putting EKG leads on -pain has significantly improved in the past 24 hours  -recommended holding off on stretching exercises for the next week -warm compressed or heating pad several times daily -encouraged her to call if pain worsens or develops with exertion -at this time I do not think she needs any further workup unless pain returns  2.  SVT with WPW -denies any palpitations  3.  Bilateral carotid artery stenosis -< 50% by dopplers 2018 -repeat dopplers to make sure this is stable -continue ASA and statin  4.  HLD -LDL goal < 70 -continue pravastatin 10mg  daily and Zetia 10mg  daily  Medication Adjustments/Labs and Tests Ordered: Current medicines are reviewed at length with the patient today.  Concerns regarding medicines are outlined  above.  Orders Placed This Encounter  Procedures  . EKG 12-Lead   No orders of the defined types were placed in this encounter.   Signed, Fransico Him, MD  01/05/2020 8:58 AM    [place]

## 2020-01-03 NOTE — Patient Instructions (Signed)

## 2020-01-05 ENCOUNTER — Encounter: Payer: Self-pay | Admitting: Cardiology

## 2020-01-10 NOTE — Telephone Encounter (Signed)
The coronary CT is the one insurance will pay for but since your chest pain has resolved and likely related to musculoskeletal etiology the insurance will not pay for this.  The coronary calcium score is a quick CT with no contrast to screen for calcium in your coronary arteries.  It is not covered at all by insurance so we charged a one time fee of $150

## 2020-02-13 ENCOUNTER — Other Ambulatory Visit: Payer: Self-pay

## 2020-02-13 MED ORDER — PRAVASTATIN SODIUM 20 MG PO TABS: 10.0000 mg | ORAL_TABLET | Freq: Every evening | ORAL | 3 refills | Status: DC

## 2020-02-27 DIAGNOSIS — Z1231 Encounter for screening mammogram for malignant neoplasm of breast: Secondary | ICD-10-CM | POA: Diagnosis not present

## 2020-02-27 DIAGNOSIS — Z01419 Encounter for gynecological examination (general) (routine) without abnormal findings: Secondary | ICD-10-CM | POA: Diagnosis not present

## 2020-02-28 ENCOUNTER — Other Ambulatory Visit: Payer: Self-pay | Admitting: Obstetrics

## 2020-02-28 DIAGNOSIS — E2839 Other primary ovarian failure: Secondary | ICD-10-CM

## 2020-03-26 ENCOUNTER — Ambulatory Visit
Admission: RE | Admit: 2020-03-26 | Discharge: 2020-03-26 | Disposition: A | Discharge status: Home / Self Care | Payer: Medicare HMO | Source: Ambulatory Visit | Attending: Obstetrics | Admitting: Obstetrics

## 2020-03-26 ENCOUNTER — Other Ambulatory Visit: Payer: Self-pay

## 2020-03-26 DIAGNOSIS — Z780 Asymptomatic menopausal state: Secondary | ICD-10-CM | POA: Diagnosis not present

## 2020-03-26 DIAGNOSIS — E2839 Other primary ovarian failure: Secondary | ICD-10-CM

## 2020-03-26 DIAGNOSIS — M85851 Other specified disorders of bone density and structure, right thigh: Secondary | ICD-10-CM | POA: Diagnosis not present

## 2020-03-26 DIAGNOSIS — M810 Age-related osteoporosis without current pathological fracture: Secondary | ICD-10-CM | POA: Diagnosis not present

## 2020-03-28 LAB — HM DEXA SCAN

## 2020-04-02 DIAGNOSIS — M810 Age-related osteoporosis without current pathological fracture: Secondary | ICD-10-CM | POA: Diagnosis not present

## 2020-05-01 LAB — HM MAMMOGRAPHY: HM Mammogram: NORMAL (ref 0–4)

## 2020-05-07 ENCOUNTER — Telehealth: Payer: Self-pay | Admitting: Radiology

## 2020-05-07 DIAGNOSIS — R001 Bradycardia, unspecified: Secondary | ICD-10-CM

## 2020-05-07 NOTE — Telephone Encounter (Signed)
Enrolled patient for a 3 day Zio monitor to be mailed to patients home.  

## 2020-05-18 ENCOUNTER — Ambulatory Visit (INDEPENDENT_AMBULATORY_CARE_PROVIDER_SITE_OTHER): Payer: Medicare HMO

## 2020-05-18 DIAGNOSIS — R001 Bradycardia, unspecified: Secondary | ICD-10-CM

## 2020-05-19 ENCOUNTER — Encounter: Payer: Self-pay | Admitting: Family Medicine

## 2020-05-22 ENCOUNTER — Encounter: Payer: Self-pay | Admitting: Family Medicine

## 2020-06-10 DIAGNOSIS — R001 Bradycardia, unspecified: Secondary | ICD-10-CM | POA: Diagnosis not present

## 2020-07-08 ENCOUNTER — Other Ambulatory Visit: Payer: Self-pay | Admitting: Cardiology

## 2020-09-03 DIAGNOSIS — H35363 Drusen (degenerative) of macula, bilateral: Secondary | ICD-10-CM | POA: Diagnosis not present

## 2020-09-19 ENCOUNTER — Other Ambulatory Visit: Payer: Self-pay | Admitting: Cardiology

## 2020-11-13 ENCOUNTER — Other Ambulatory Visit: Payer: Self-pay | Admitting: Cardiology

## 2020-11-16 ENCOUNTER — Ambulatory Visit: Payer: Medicare HMO | Admitting: Nurse Practitioner

## 2020-11-17 ENCOUNTER — Ambulatory Visit: Payer: Medicare HMO | Admitting: Physician Assistant

## 2020-12-09 NOTE — Progress Notes (Addendum)
Cardiology Office Note:    Date:  12/10/2020   ID:  Amber Nunez, DOB November 15, 1951, MRN [id number]  PCP:  [place], Betty G, MD  Cardiologist:  Armanda Magic, MD    Referring MD: [place], Betty G, MD   Chief Complaint  Patient presents with  . Follow-up    WPW, HLD and carotid artery stenosis    History of Present Illness:    Amber Nunez is a 69 y.o. 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   Bilateral carotid dopplers in 2020 showed 1-39% bilateral carotid stenosis.  She is here today for followup and is doing well.  She denies any chest pain or pressure, SOB, DOE, PND, orthopnea, LE edema or syncope. Occasionally she will feel a vibrating sensation in there chest but she checks her pulse and it is normal.  She has purchased an apple watch which so far has been normal.  She did have an episode where she got hot first and then felt dizzy and had to sit down.  She thought it might be her BS and ate some sugar and it resolved. She is compliant with her meds and is tolerating meds with no SE.    Past Medical History:  Diagnosis Date  . Abnormal electrocardiogram (ECG) (EKG)   . Carotid artery stenosis 06/19/2019   < 50% bilateral stenosis on dopplers 2018  . Essential hypertension, benign   . Familial hypercholesterolemia   . Hx of dizziness   . Hx of syncope   . Sinus bradycardia   . SVT (supraventricular tachycardia) ([facility])   . Wolff-Parkinson-White (WPW) syndrome     Past Surgical History:  Procedure Laterality Date  . ABLATION  2000   had 2 for WPW  . BREAST BIOPSY Right    negative  . BREAST SURGERY     BIOPSY, 3 YEARS AGO  . MOLE REMOVAL    . OVARIES REMOVED      Current Medications: Current Meds  Medication Sig  . alendronate (FOSAMAX) 70 MG tablet Take 1 tablet by mouth once a week.  Marland Kitchen aspirin 81 MG tablet Take 81 mg by mouth  daily.  . Biotin 20254 MCG TABS Take 10,000 mcg by mouth daily.   . calcium carbonate (OS-CAL) 600 MG tablet Take 600 mg by mouth daily.   . clobetasol cream (TEMOVATE) 0.05 % Apply 1 application topically as needed. Apply one application of cream 0.05% to chest daily as needed for rash.  . ezetimibe (ZETIA) 10 MG tablet TAKE 1 TABLET EVERY DAY (PLEASE SCHEDULE APPOINTMENT FOR FURTHER REFILLS)  . Multiple Vitamin (MULTIVITAMIN WITH MINERALS) TABS tablet Take 1 tablet by mouth daily.  . multivitamin-lutein (OCUVITE-LUTEIN) CAPS capsule Take 1 capsule by mouth daily.  . pravastatin (PRAVACHOL) 20 MG tablet Take 0.5 tablets (10 mg total) by mouth every evening.  . tacrolimus (PROTOPIC) 0.03 % ointment Apply topically as needed.  . vitamin C (ASCORBIC ACID) 500 MG tablet Take 500 mg by mouth daily.     Allergies:   Penicillins, Atorvastatin, Lortab [hydrocodone-acetaminophen], and Zebeta [bisoprolol fumarate]   Social History   Socioeconomic History  . Marital status: Married    Spouse name: Not on file  . Number of children: Not on file  . Years of education: Not on file  . Highest education level: Not on file  Occupational History  .  Not on file  Tobacco Use  . Smoking status: Former Smoker    Packs/day: 0.50    Years: 10.00    Pack years: 5.00    Types: Cigarettes    Quit date: 1990    Years since quitting: 32.0  . Smokeless tobacco: Never Used  Vaping Use  . Vaping Use: Never used  Substance and Sexual Activity  . Alcohol use: Yes    Comment: One drink per month  . Drug use: No  . Sexual activity: Never    Partners: Male    Comment: MARRIEDE  Other Topics Concern  . Not on file  Social History Narrative  . Not on file   Social Determinants of Health   Financial Resource Strain: Not on file  Food Insecurity: Not on file  Transportation Needs: Not on file  Physical Activity: Not on file  Stress: Not on file  Social Connections: Not on file     Family  History: The patient's family history includes Cancer in her father; Heart attack (age of onset: 67) in her father; Heart disease in her father and mother; Hyperlipidemia in her brother and father. There is no history of Breast cancer.  ROS:   Please see the history of present illness.    ROS  All other systems reviewed and negative.   EKGs/Labs/Other Studies Reviewed:    The following studies were reviewed today: PAP compliance download  EKG:  EKG is  ordered today.  The ekg ordered today demonstrates NSR with no ST changes  Recent Labs: 12/19/2019: ALT 20   Recent Lipid Panel    Component Value Date/Time   CHOL 169 12/19/2019 0924   TRIG 54 12/19/2019 0924   HDL 88 12/19/2019 0924   CHOLHDL 1.9 12/19/2019 0924   CHOLHDL 3 01/09/2018 0757   VLDL 16.8 01/09/2018 0757   LDLCALC 70 12/19/2019 0924    Physical Exam:    VS:  BP 122/86   Pulse 69   Ht 5\' 2"  (1.575 m)   Wt 119 lb 3.2 oz (54.1 kg)   SpO2 97%   BMI 21.80 kg/m     Wt Readings from Last 3 Encounters:  12/10/20 119 lb 3.2 oz (54.1 kg)  01/03/20 118 lb 3.2 oz (53.6 kg)  06/20/19 123 lb (55.8 kg)     GEN: Well nourished, well developed in no acute distress HEENT: Normal NECK: No JVD; No carotid bruits LYMPHATICS: No lymphadenopathy CARDIAC:RRR, no murmurs, rubs, gallops RESPIRATORY:  Clear to auscultation without rales, wheezing or rhonchi  ABDOMEN: Soft, non-tender, non-distended MUSCULOSKELETAL:  No edema; No deformity  SKIN: Warm and dry NEUROLOGIC:  Alert and oriented x 3 PSYCHIATRIC:  Normal affect    ASSESSMENT:    1. WPW (Wolff-Parkinson-White syndrome)   2. Bilateral carotid artery stenosis   3. Pure hypercholesterolemia    PLAN:    In order of problems listed above:  1.  SVT with WPW -she has not had any palpitations but has a vibrating sensation in her chest but pulse was normal when checked -she will check her iwatch and get an EKG reading the next time she has an episode of the  vibrating or dizziness and also check her BP and call with results of BP and download tele onto my chart -check TSH, CBC, CMET  3.  Bilateral carotid artery stenosis -dopplers 06/2019 with 1-39% stenosis>>repeat dopplers 06/2021 -continue ASA and statin  4.  HLD -LDL goal < 70 -LDL was 70 a year ago -repeat  FLP and ALT -continue pravastatin 10mg  daily and Zetia 10mg  daily  Medication Adjustments/Labs and Tests Ordered: Current medicines are reviewed at length with the patient today.  Concerns regarding medicines are outlined above.  Orders Placed This Encounter  Procedures  . Lipid panel  . Comprehensive metabolic panel  . TSH  . CBC  . EKG 12-Lead   No orders of the defined types were placed in this encounter.   Signed, Fransico Him, MD  12/10/2020 8:48 PM    [place]

## 2020-12-10 ENCOUNTER — Encounter: Payer: Self-pay | Admitting: Cardiology

## 2020-12-10 ENCOUNTER — Other Ambulatory Visit: Payer: Self-pay

## 2020-12-10 ENCOUNTER — Ambulatory Visit: Payer: Medicare HMO | Admitting: Cardiology

## 2020-12-10 VITALS — BP 122/86 | HR 69 | Ht 62.0 in | Wt 119.2 lb

## 2020-12-10 DIAGNOSIS — I6523 Occlusion and stenosis of bilateral carotid arteries: Secondary | ICD-10-CM | POA: Diagnosis not present

## 2020-12-10 DIAGNOSIS — I456 Pre-excitation syndrome: Secondary | ICD-10-CM

## 2020-12-10 DIAGNOSIS — E7800 Pure hypercholesterolemia, unspecified: Secondary | ICD-10-CM

## 2020-12-10 NOTE — Patient Instructions (Signed)
Medication Instructions:  Your physician recommends that you continue on your current medications as directed. Please refer to the Current Medication list given to you today.  *If you need a refill on your cardiac medications before your next appointment, please call your pharmacy*   Lab Work: Fasting lipids, TSH, CBC, CMET If you have labs (blood work) drawn today and your tests are completely normal, you will receive your results only by: Marland Kitchen MyChart Message (if you have MyChart) OR . A paper copy in the mail If you have any lab test that is abnormal or we need to change your treatment, we will call you to review the results.  Follow-Up: At [facility], you and your health needs are our priority.  As part of our continuing mission to provide you with exceptional heart care, we have created designated Provider Care Teams.  These Care Teams include your primary Cardiologist (physician) and Advanced Practice Providers (APPs -  Physician Assistants and Nurse Practitioners) who all work together to provide you with the care you need, when you need it.   Your next appointment:   1 year(s)  The format for your next appointment:   In Person  Provider:   You may see Armanda Magic, MD or one of the following Advanced Practice Providers on your designated Care Team:    Ronie Spies, PA-C  Jacolyn Reedy, PA-C

## 2020-12-10 NOTE — Addendum Note (Signed)
Addended by: Theresia Majors on: 12/10/2020 09:50 AM   Modules accepted: Orders

## 2020-12-17 ENCOUNTER — Other Ambulatory Visit: Payer: Medicare HMO

## 2020-12-17 ENCOUNTER — Other Ambulatory Visit: Payer: Self-pay

## 2020-12-17 DIAGNOSIS — I6523 Occlusion and stenosis of bilateral carotid arteries: Secondary | ICD-10-CM | POA: Diagnosis not present

## 2020-12-17 DIAGNOSIS — E7800 Pure hypercholesterolemia, unspecified: Secondary | ICD-10-CM | POA: Diagnosis not present

## 2020-12-17 DIAGNOSIS — I456 Pre-excitation syndrome: Secondary | ICD-10-CM

## 2020-12-17 LAB — COMPREHENSIVE METABOLIC PANEL
ALT: 17 IU/L (ref 0–32)
AST: 30 IU/L (ref 0–40)
Albumin/Globulin Ratio: 1.8 (ref 1.2–2.2)
Albumin: 4.8 g/dL (ref 3.8–4.8)
Alkaline Phosphatase: 58 IU/L (ref 44–121)
BUN/Creatinine Ratio: 20 (ref 12–28)
BUN: 14 mg/dL (ref 8–27)
Bilirubin Total: 0.5 mg/dL (ref 0.0–1.2)
CO2: 25 mmol/L (ref 20–29)
Calcium: 9.2 mg/dL (ref 8.7–10.3)
Chloride: 100 mmol/L (ref 96–106)
Creatinine, Ser: 0.7 mg/dL (ref 0.57–1.00)
GFR calc Af Amer: 102 mL/min/{1.73_m2} (ref >59)
GFR calc non Af Amer: 89 mL/min/{1.73_m2} (ref >59)
Globulin, Total: 2.6 g/dL (ref 1.5–4.5)
Glucose: 90 mg/dL (ref 65–99)
Potassium: 4.1 mmol/L (ref 3.5–5.2)
Sodium: 139 mmol/L (ref 134–144)
Total Protein: 7.4 g/dL (ref 6.0–8.5)

## 2020-12-17 LAB — CBC
Hematocrit: 41.2 % (ref 34.0–46.6)
Hemoglobin: 13.5 g/dL (ref 11.1–15.9)
MCH: 29.8 pg (ref 26.6–33.0)
MCHC: 32.8 g/dL (ref 31.5–35.7)
MCV: 91 fL (ref 79–97)
Platelets: 216 10*3/uL (ref 150–450)
RBC: 4.53 x10E6/uL (ref 3.77–5.28)
RDW: 12.7 % (ref 11.7–15.4)
WBC: 5.4 10*3/uL (ref 3.4–10.8)

## 2020-12-17 LAB — LIPID PANEL
Chol/HDL Ratio: 2.1 ratio (ref 0.0–4.4)
Cholesterol, Total: 195 mg/dL (ref 100–199)
HDL: 93 mg/dL (ref >39)
LDL Chol Calc (NIH): 90 mg/dL (ref 0–99)
Triglycerides: 64 mg/dL (ref 0–149)
VLDL Cholesterol Cal: 12 mg/dL (ref 5–40)

## 2020-12-17 LAB — TSH: TSH: 1.11 u[IU]/mL (ref 0.450–4.500)

## 2021-01-08 ENCOUNTER — Other Ambulatory Visit: Payer: Self-pay | Admitting: Cardiology

## 2021-01-27 NOTE — Progress Notes (Signed)
Subjective:   Amber Nunez is a 70 y.o. female who presents for an Initial Medicare Annual Wellness Visit.  Review of Systems    N/A  Cardiac Risk Factors include: advanced age (>38men, >78 women)     Objective:    Today's Vitals   01/28/21 1023  BP: 130/76  Pulse: 69  Temp: 98 F (36.7 C)  TempSrc: Oral  SpO2: 98%  Weight: 118 lb 1 oz (53.6 kg)  Height: 5\' 2"  (1.575 m)   Body mass index is 21.59 kg/m.  Advanced Directives 01/28/2021  Does Patient Have a Medical Advance Directive? No  Would patient like information on creating a medical advance directive? No - Patient declined    Current Medications (verified) Outpatient Encounter Medications as of 01/28/2021  Medication Sig  . alendronate (FOSAMAX) 70 MG tablet Take 1 tablet by mouth once a week.  Marland Kitchen aspirin 81 MG tablet Take 81 mg by mouth daily.  . Biotin 10000 MCG TABS Take 10,000 mcg by mouth daily.   . calcium carbonate (OS-CAL) 600 MG tablet Take 600 mg by mouth daily.   . clobetasol cream (TEMOVATE) 1.63 % Apply 1 application topically as needed. Apply one application of cream 8.46% to chest daily as needed for rash.  . ezetimibe (ZETIA) 10 MG tablet Take 1 tablet (10 mg total) by mouth daily.  . Multiple Vitamin (MULTIVITAMIN WITH MINERALS) TABS tablet Take 1 tablet by mouth daily.  . multivitamin-lutein (OCUVITE-LUTEIN) CAPS capsule Take 1 capsule by mouth daily.  . pravastatin (PRAVACHOL) 20 MG tablet Take 0.5 tablets (10 mg total) by mouth every evening.  . tacrolimus (PROTOPIC) 0.03 % ointment Apply topically as needed.  . vitamin C (ASCORBIC ACID) 500 MG tablet Take 500 mg by mouth daily.   No facility-administered encounter medications on file as of 01/28/2021.    Allergies (verified) Penicillins, Atorvastatin, Lortab [hydrocodone-acetaminophen], and Zebeta [bisoprolol fumarate]   History: Past Medical History:  Diagnosis Date  . Abnormal electrocardiogram (ECG) (EKG)   . Carotid artery  stenosis 06/19/2019   < 50% bilateral stenosis on dopplers 2018  . Essential hypertension, benign   . Familial hypercholesterolemia   . Hx of dizziness   . Hx of syncope   . Left wrist fracture   . Sinus bradycardia   . SVT (supraventricular tachycardia) (Amber Nunez)   . Wolff-Parkinson-White (WPW) syndrome    Past Surgical History:  Procedure Laterality Date  . ABLATION  2000   had 2 for WPW  . BREAST BIOPSY Right    negative  . BREAST SURGERY     BIOPSY, 3 YEARS AGO  . MOLE REMOVAL    . OVARIES REMOVED     Family History  Problem Relation Age of Onset  . Heart disease Mother        CAD  . Heart disease Father   . Hyperlipidemia Father   . Heart attack Father 37       CABG X 3  . Cancer Father   . Hyperlipidemia Brother   . Breast cancer Neg Hx    Social History   Socioeconomic History  . Marital status: Married    Spouse name: Not on file  . Number of children: Not on file  . Years of education: Not on file  . Highest education level: Not on file  Occupational History  . Not on file  Tobacco Use  . Smoking status: Former Smoker    Packs/day: 0.50    Years: 10.00  Pack years: 5.00    Types: Cigarettes    Quit date: 1990    Years since quitting: 32.1  . Smokeless tobacco: Never Used  Vaping Use  . Vaping Use: Never used  Substance and Sexual Activity  . Alcohol use: Yes    Comment: One drink per month  . Drug use: No  . Sexual activity: Never    Partners: Male    Comment: Amber Nunez  Other Topics Concern  . Not on file  Social History Narrative  . Not on file   Social Determinants of Health   Financial Resource Strain: Low Risk   . Difficulty of Paying Living Expenses: Not hard at all  Food Insecurity: No Food Insecurity  . Worried About Charity fundraiser in the Last Year: Never true  . Ran Out of Food in the Last Year: Never true  Transportation Needs: No Transportation Needs  . Lack of Transportation (Medical): No  . Lack of Transportation  (Non-Medical): No  Physical Activity: Insufficiently Active  . Days of Exercise per Week: 7 days  . Minutes of Exercise per Session: 10 min  Stress: No Stress Concern Present  . Feeling of Stress : Not at all  Social Connections: Moderately Integrated  . Frequency of Communication with Friends and Family: Once a week  . Frequency of Social Gatherings with Friends and Family: More than three times a week  . Attends Religious Services: More than 4 times per year  . Active Member of Clubs or Organizations: No  . Attends Archivist Meetings: Never  . Marital Status: Married    Tobacco Counseling Counseling given: Not Answered   Clinical Intake:  Pre-visit preparation completed: Yes  Pain : No/denies pain     Nutritional Risks: None Diabetes: No  How often do you need to have someone help you when you read instructions, pamphlets, or other written materials from your doctor or pharmacy?: 1 - Never What is the last grade level you completed in school?: 12th grade  Diabetic?No   Interpreter Needed?: No  Information entered by :: [place] of Daily Living In your present state of health, do you have any difficulty performing the following activities: 01/28/2021  Hearing? N  Vision? N  Difficulty concentrating or making decisions? N  Walking or climbing stairs? N  Dressing or bathing? N  Doing errands, shopping? N  Preparing Food and eating ? N  Using the Toilet? N  In the past six months, have you accidently leaked urine? Y  Comment has had some bladder leakage  Do you have problems with loss of bowel control? N  Managing your Medications? N  Managing your Finances? N  Housekeeping or managing your Housekeeping? N  Some recent data might be hidden    Patient Care Team: [place], Betty G, MD as PCP - General (Family Medicine) Sueanne Margarita, MD as PCP - Cardiology (Cardiology)  Indicate any recent Medical Services you may have received from  other than [facility] providers in the past year (date may be approximate).     Assessment:   This is a routine wellness examination for [place].  Hearing/Vision screen  Hearing Screening   125Hz  250Hz  500Hz  1000Hz  2000Hz  3000Hz  4000Hz  6000Hz  8000Hz   Right ear:           Left ear:           Vision Screening Comments: Gets eyes checked once per year. Has macular degeneration. Wears reading glasses  Dietary issues  and exercise activities discussed: Current Exercise Habits: Home exercise routine, Type of exercise: strength training/weights, Time (Minutes): 10, Frequency (Times/Week): 7, Weekly Exercise (Minutes/Week): 70, Intensity: Mild  Goals    . Patient Stated     I will continue to do my stretches every morning!       Depression Screen PHQ 2/9 Scores 01/28/2021  PHQ - 2 Score 0    Fall Risk Fall Risk  01/28/2021  Falls in the past year? 0  Number falls in past yr: 0  Injury with Fall? 0  Risk for fall due to : No Fall Risks  Follow up Falls evaluation completed;Falls prevention discussed    FALL RISK PREVENTION PERTAINING TO THE HOME:  Any stairs in or around the home? No  If so, are there any without handrails? No  Home free of loose throw rugs in walkways, pet beds, electrical cords, etc? Yes  Adequate lighting in your home to reduce risk of falls? Yes   ASSISTIVE DEVICES UTILIZED TO PREVENT FALLS:  Life alert? No  Use of a cane, walker or w/c? No  Grab bars in the bathroom? Yes  Shower chair or bench in shower? No  Elevated toilet seat or a handicapped toilet? No     Cognitive Function: Normal cognitive status assessed by direct observation by this Nurse Health Advisor. No abnormalities found.          Immunizations Immunization History  Administered Date(s) Administered  . PFIZER Comirnaty(Gray Top)Covid-19 Tri-Sucrose Vaccine 01/02/2021  . PFIZER(Purple Top)SARS-COV-2 Vaccination 03/07/2020, 03/28/2020    TDAP status: Due, Education has been provided  regarding the importance of this vaccine. Advised may receive this vaccine at local pharmacy or Health Dept. Aware to provide a copy of the vaccination record if obtained from local pharmacy or Health Dept. Verbalized acceptance and understanding.  Flu Vaccine status: Due, Education has been provided regarding the importance of this vaccine. Advised may receive this vaccine at local pharmacy or Health Dept. Aware to provide a copy of the vaccination record if obtained from local pharmacy or Health Dept. Verbalized acceptance and understanding.  Pneumococcal vaccine status: Due, Education has been provided regarding the importance of this vaccine. Advised may receive this vaccine at local pharmacy or Health Dept. Aware to provide a copy of the vaccination record if obtained from local pharmacy or Health Dept. Verbalized acceptance and understanding.  Covid-19 vaccine status: Completed vaccines  Qualifies for Shingles Vaccine? Yes   Zostavax completed No   Shingrix Completed?: No.    Education has been provided regarding the importance of this vaccine. Patient has been advised to call insurance company to determine out of pocket expense if they have not yet received this vaccine. Advised may also receive vaccine at local pharmacy or Health Dept. Verbalized acceptance and understanding.  Screening Tests Health Maintenance  Topic Date Due  . Hepatitis C Screening  Never done  . PNA vac Low Risk Adult (1 of 2 - PCV13) Never done  . INFLUENZA VACCINE  Never done  . MAMMOGRAM  05/01/2021  . COVID-19 Vaccine (4 - Booster for Pfizer series) 07/02/2021  . COLONOSCOPY (Pts 45-57yrs Insurance coverage will need to be confirmed)  03/01/2025  . TETANUS/TDAP  03/13/2027  . DEXA SCAN  Completed    Health Maintenance  Health Maintenance Due  Topic Date Due  . Hepatitis C Screening  Never done  . PNA vac Low Risk Adult (1 of 2 - PCV13) Never done  . INFLUENZA VACCINE  Never done  Colorectal cancer  screening: Type of screening: Colonoscopy. Completed 03/02/2015. Repeat every 10 years  Mammogram status: Completed 05/01/2020. Repeat every year  Bone Density status: Completed 03/28/2020. Results reflect: Bone density results: OSTEOPENIA. Repeat every 2 years.  Lung Cancer Screening: (Low Dose CT Chest recommended if Age 32-80 years, 30 pack-year currently smoking OR have quit w/in 15years.) does not qualify.   Lung Cancer Screening Referral: N/A   Additional Screening:  Hepatitis C Screening: does qualify;  Vision Screening: Recommended annual ophthalmology exams for early detection of glaucoma and other disorders of the eye. Is the patient up to date with their annual eye exam?  Yes  Who is the provider or what is the name of the office in which the patient attends annual eye exams? Dr. [place] If pt is not established with a provider, would they like to be referred to a provider to establish care? No .   Dental Screening: Recommended annual dental exams for proper oral hygiene  Community Resource Referral / Chronic Care Management: CRR required this visit?  No   CCM required this visit?  No      Plan:     I have personally reviewed and noted the following in the patient's chart:   . Medical and social history . Use of alcohol, tobacco or illicit drugs  . Current medications and supplements . Functional ability and status . Nutritional status . Physical activity . Advanced directives . List of other physicians . Hospitalizations, surgeries, and ER visits in previous 12 months . Vitals . Screenings to include cognitive, depression, and falls . Referrals and appointments  In addition, I have reviewed and discussed with patient certain preventive protocols, quality metrics, and best practice recommendations. A written personalized care plan for preventive services as well as general preventive health recommendations were provided to patient.     Ofilia Neas,  LPN   [id number]   Nurse Notes: None

## 2021-01-28 ENCOUNTER — Ambulatory Visit (INDEPENDENT_AMBULATORY_CARE_PROVIDER_SITE_OTHER): Payer: Medicare HMO

## 2021-01-28 ENCOUNTER — Other Ambulatory Visit: Payer: Self-pay

## 2021-01-28 VITALS — BP 130/76 | HR 69 | Temp 98.0°F | Ht 62.0 in | Wt 118.1 lb

## 2021-01-28 DIAGNOSIS — Z0000 Encounter for general adult medical examination without abnormal findings: Secondary | ICD-10-CM | POA: Diagnosis not present

## 2021-01-28 NOTE — Patient Instructions (Signed)
Amber Nunez , Thank you for taking time to come for your Medicare Wellness Visit. I appreciate your ongoing commitment to your health goals. Please review the following plan we discussed and let me know if I can assist you in the future.   Screening recommendations/referrals: Colonoscopy: Up to date, next due 03/01/2025 Mammogram: Up to date, next due 05/01/2021  Bone Density: Up to date, next due 03/28/2022  Recommended yearly ophthalmology/optometry visit for glaucoma screening and checkup Recommended yearly dental visit for hygiene and checkup  Vaccinations: Influenza vaccine: Patient declined  Pneumococcal vaccine: Completed series  Tdap vaccine: Currently due,you may await and injury to receive or receive at your local pharmacy Shingles vaccine: Completed series    Advanced directives: Advance directive discussed with you today. Even though you declined this today please call our office should you change your mind and we can give you the proper paperwork for you to fill out.   Conditions/risks identified: None   Next appointment: None   Preventive Care 65 Years and Older, Female Preventive care refers to lifestyle choices and visits with your health care provider that can promote health and wellness. What does preventive care include?  A yearly physical exam. This is also called an annual well check.  Dental exams once or twice a year.  Routine eye exams. Ask your health care provider how often you should have your eyes checked.  Personal lifestyle choices, including:  Daily care of your teeth and gums.  Regular physical activity.  Eating a healthy diet.  Avoiding tobacco and drug use.  Limiting alcohol use.  Practicing safe sex.  Taking low-dose aspirin every day.  Taking vitamin and mineral supplements as recommended by your health care provider. What happens during an annual well check? The services and screenings done by your health care provider  during your annual well check will depend on your age, overall health, lifestyle risk factors, and family history of disease. Counseling  Your health care provider may ask you questions about your:  Alcohol use.  Tobacco use.  Drug use.  Emotional well-being.  Home and relationship well-being.  Sexual activity.  Eating habits.  History of falls.  Memory and ability to understand (cognition).  Work and work Statistician.  Reproductive health. Screening  You may have the following tests or measurements:  Height, weight, and BMI.  Blood pressure.  Lipid and cholesterol levels. These may be checked every 5 years, or more frequently if you are over 42 years old.  Skin check.  Lung cancer screening. You may have this screening every year starting at age 64 if you have a 30-pack-year history of smoking and currently smoke or have quit within the past 15 years.  Fecal occult blood test (FOBT) of the stool. You may have this test every year starting at age 27.  Flexible sigmoidoscopy or colonoscopy. You may have a sigmoidoscopy every 5 years or a colonoscopy every 10 years starting at age 67.  Hepatitis C blood test.  Hepatitis B blood test.  Sexually transmitted disease (STD) testing.  Diabetes screening. This is done by checking your blood sugar (glucose) after you have not eaten for a while (fasting). You may have this done every 1-3 years.  Bone density scan. This is done to screen for osteoporosis. You may have this done starting at age 41.  Mammogram. This may be done every 1-2 years. Talk to your health care provider about how often you should have regular mammograms. Talk with your health care  provider about your test results, treatment options, and if necessary, the need for more tests. Vaccines  Your health care provider may recommend certain vaccines, such as:  Influenza vaccine. This is recommended every year.  Tetanus, diphtheria, and acellular pertussis  (Tdap, Td) vaccine. You may need a Td booster every 10 years.  Zoster vaccine. You may need this after age 2.  Pneumococcal 13-valent conjugate (PCV13) vaccine. One dose is recommended after age 12.  Pneumococcal polysaccharide (PPSV23) vaccine. One dose is recommended after age 85. Talk to your health care provider about which screenings and vaccines you need and how often you need them. This information is not intended to replace advice given to you by your health care provider. Make sure you discuss any questions you have with your health care provider. Document Released: 12/25/2015 Document Revised: 08/17/2016 Document Reviewed: 09/29/2015 Elsevier Interactive Patient Education  2017 [place] Prevention in the Home Falls can cause injuries. They can happen to people of all ages. There are many things you can do to make your home safe and to help prevent falls. What can I do on the outside of my home?  Regularly fix the edges of walkways and driveways and fix any cracks.  Remove anything that might make you trip as you walk through a door, such as a raised step or threshold.  Trim any bushes or trees on the path to your home.  Use bright outdoor lighting.  Clear any walking paths of anything that might make someone trip, such as rocks or tools.  Regularly check to see if handrails are loose or broken. Make sure that both sides of any steps have handrails.  Any raised decks and porches should have guardrails on the edges.  Have any leaves, snow, or ice cleared regularly.  Use sand or salt on walking paths during winter.  Clean up any spills in your garage right away. This includes oil or grease spills. What can I do in the bathroom?  Use night lights.  Install grab bars by the toilet and in the tub and shower. Do not use towel bars as grab bars.  Use non-skid mats or decals in the tub or shower.  If you need to sit down in the shower, use a plastic, non-slip  stool.  Keep the floor dry. Clean up any water that spills on the floor as soon as it happens.  Remove soap buildup in the tub or shower regularly.  Attach bath mats securely with double-sided non-slip rug tape.  Do not have throw rugs and other things on the floor that can make you trip. What can I do in the bedroom?  Use night lights.  Make sure that you have a light by your bed that is easy to reach.  Do not use any sheets or blankets that are too big for your bed. They should not hang down onto the floor.  Have a firm chair that has side arms. You can use this for support while you get dressed.  Do not have throw rugs and other things on the floor that can make you trip. What can I do in the kitchen?  Clean up any spills right away.  Avoid walking on wet floors.  Keep items that you use a lot in easy-to-reach places.  If you need to reach something above you, use a strong step stool that has a grab bar.  Keep electrical cords out of the way.  Do not use floor polish  or wax that makes floors slippery. If you must use wax, use non-skid floor wax.  Do not have throw rugs and other things on the floor that can make you trip. What can I do with my stairs?  Do not leave any items on the stairs.  Make sure that there are handrails on both sides of the stairs and use them. Fix handrails that are broken or loose. Make sure that handrails are as long as the stairways.  Check any carpeting to make sure that it is firmly attached to the stairs. Fix any carpet that is loose or worn.  Avoid having throw rugs at the top or bottom of the stairs. If you do have throw rugs, attach them to the floor with carpet tape.  Make sure that you have a light switch at the top of the stairs and the bottom of the stairs. If you do not have them, ask someone to add them for you. What else can I do to help prevent falls?  Wear shoes that:  Do not have high heels.  Have rubber bottoms.  Are  comfortable and fit you well.  Are closed at the toe. Do not wear sandals.  If you use a stepladder:  Make sure that it is fully opened. Do not climb a closed stepladder.  Make sure that both sides of the stepladder are locked into place.  Ask someone to hold it for you, if possible.  Clearly mark and make sure that you can see:  Any grab bars or handrails.  First and last steps.  Where the edge of each step is.  Use tools that help you move around (mobility aids) if they are needed. These include:  Canes.  Walkers.  Scooters.  Crutches.  Turn on the lights when you go into a dark area. Replace any light bulbs as soon as they burn out.  Set up your furniture so you have a clear path. Avoid moving your furniture around.  If any of your floors are uneven, fix them.  If there are any pets around you, be aware of where they are.  Review your medicines with your doctor. Some medicines can make you feel dizzy. This can increase your chance of falling. Ask your doctor what other things that you can do to help prevent falls. This information is not intended to replace advice given to you by your health care provider. Make sure you discuss any questions you have with your health care provider. Document Released: 09/24/2009 Document Revised: 05/05/2016 Document Reviewed: 01/02/2015 Elsevier Interactive Patient Education  2017 [employer].

## 2021-02-16 ENCOUNTER — Other Ambulatory Visit: Payer: Self-pay

## 2021-02-16 MED ORDER — PRAVASTATIN SODIUM 20 MG PO TABS: 10.0000 mg | ORAL_TABLET | Freq: Every evening | ORAL | 2 refills | Status: DC

## 2021-03-11 ENCOUNTER — Other Ambulatory Visit: Payer: Self-pay

## 2021-03-11 ENCOUNTER — Ambulatory Visit (INDEPENDENT_AMBULATORY_CARE_PROVIDER_SITE_OTHER): Payer: Medicare HMO | Admitting: Otolaryngology

## 2021-03-11 VITALS — Temp 97.9°F

## 2021-03-11 DIAGNOSIS — N952 Postmenopausal atrophic vaginitis: Secondary | ICD-10-CM | POA: Insufficient documentation

## 2021-03-11 DIAGNOSIS — H6121 Impacted cerumen, right ear: Secondary | ICD-10-CM

## 2021-03-11 DIAGNOSIS — N941 Unspecified dyspareunia: Secondary | ICD-10-CM | POA: Insufficient documentation

## 2021-03-11 DIAGNOSIS — H3530 Unspecified macular degeneration: Secondary | ICD-10-CM | POA: Insufficient documentation

## 2021-03-11 DIAGNOSIS — M810 Age-related osteoporosis without current pathological fracture: Secondary | ICD-10-CM

## 2021-03-11 DIAGNOSIS — I10 Essential (primary) hypertension: Secondary | ICD-10-CM | POA: Insufficient documentation

## 2021-03-11 DIAGNOSIS — E2839 Other primary ovarian failure: Secondary | ICD-10-CM | POA: Insufficient documentation

## 2021-03-11 HISTORY — DX: Age-related osteoporosis without current pathological fracture: M81.0

## 2021-03-11 NOTE — Progress Notes (Signed)
HPI: Amber Nunez is a 70 y.o. female who presents for evaluation of blockage of hearing in her right ear.  She has had this for a couple weeks.  She has tried peroxide which seems to help a little bit but it still is not all the way clear.  She presents here to have it checked and cleaned..  Past Medical History:  Diagnosis Date  . Abnormal electrocardiogram (ECG) (EKG)   . Carotid artery stenosis 06/19/2019   < 50% bilateral stenosis on dopplers 2018  . Essential hypertension, benign   . Familial hypercholesterolemia   . Hx of dizziness   . Hx of syncope   . Left wrist fracture   . Sinus bradycardia   . SVT (supraventricular tachycardia) ([place])   . Wolff-Parkinson-White (WPW) syndrome    Past Surgical History:  Procedure Laterality Date  . ABLATION  2000   had 2 for WPW  . BREAST BIOPSY Right    negative  . BREAST SURGERY     BIOPSY, 3 YEARS AGO  . MOLE REMOVAL    . OVARIES REMOVED     Social History   Socioeconomic History  . Marital status: Married    Spouse name: Not on file  . Number of children: Not on file  . Years of education: Not on file  . Highest education level: Not on file  Occupational History  . Not on file  Tobacco Use  . Smoking status: Former Smoker    Packs/day: 0.50    Years: 10.00    Pack years: 5.00    Types: Cigarettes    Quit date: 1990    Years since quitting: 32.2  . Smokeless tobacco: Never Used  Vaping Use  . Vaping Use: Never used  Substance and Sexual Activity  . Alcohol use: Yes    Comment: One drink per month  . Drug use: No  . Sexual activity: Never    Partners: Male    Comment: MARRIEDE  Other Topics Concern  . Not on file  Social History Narrative  . Not on file   Social Determinants of Health   Financial Resource Strain: Low Risk   . Difficulty of Paying Living Expenses: Not hard at all  Food Insecurity: No Food Insecurity  . Worried About Charity fundraiser in the Last Year: Never true  . Ran Out of Food in  the Last Year: Never true  Transportation Needs: No Transportation Needs  . Lack of Transportation (Medical): No  . Lack of Transportation (Non-Medical): No  Physical Activity: Insufficiently Active  . Days of Exercise per Week: 7 days  . Minutes of Exercise per Session: 10 min  Stress: No Stress Concern Present  . Feeling of Stress : Not at all  Social Connections: Moderately Integrated  . Frequency of Communication with Friends and Family: Once a week  . Frequency of Social Gatherings with Friends and Family: More than three times a week  . Attends Religious Services: More than 4 times per year  . Active Member of Clubs or Organizations: No  . Attends Archivist Meetings: Never  . Marital Status: Married   Family History  Problem Relation Age of Onset  . Heart disease Mother        CAD  . Heart disease Father   . Hyperlipidemia Father   . Heart attack Father 49       CABG X 3  . Cancer Father   . Hyperlipidemia Brother   .  Breast cancer Neg Hx    Allergies  Allergen Reactions  . Penicillins Nausea And Vomiting  . Atorvastatin     cough  . Lortab [Hydrocodone-Acetaminophen] Other (See Comments)    Passing out  . Zebeta [Bisoprolol Fumarate] Rash   Prior to Admission medications   Medication Sig Start Date End Date Taking? Authorizing Provider  alendronate (FOSAMAX) 70 MG tablet Take 1 tablet by mouth once a week. 09/05/20   [provider]  aspirin 81 MG tablet Take 81 mg by mouth daily.    [provider]  Biotin 10000 MCG TABS Take 10,000 mcg by mouth daily.  03/13/19   [provider]  calcium carbonate (OS-CAL) 600 MG tablet Take 600 mg by mouth daily.  03/13/19   [provider]  clobetasol cream (TEMOVATE) 4.03 % Apply 1 application topically as needed. Apply one application of cream 4.74% to chest daily as needed for rash.    [provider]  ezetimibe (ZETIA) 10 MG tablet Take 1 tablet (10 mg total) by mouth  daily. 01/08/21   Sueanne Margarita, MD  Multiple Vitamin (MULTIVITAMIN WITH MINERALS) TABS tablet Take 1 tablet by mouth daily.    [provider]  multivitamin-lutein (OCUVITE-LUTEIN) CAPS capsule Take 1 capsule by mouth daily.    [provider]  pravastatin (PRAVACHOL) 20 MG tablet Take 0.5 tablets (10 mg total) by mouth every evening. 02/16/21   Sueanne Margarita, MD  tacrolimus (PROTOPIC) 0.03 % ointment Apply topically as needed.    [provider]  vitamin C (ASCORBIC ACID) 500 MG tablet Take 500 mg by mouth daily.    [provider]     Positive ROS: Otherwise negative  All other systems have been reviewed and were otherwise negative with the exception of those mentioned in the HPI and as above.  Physical Exam: Constitutional: Alert, well-appearing, no acute distress Ears: External ears without lesions or tenderness. Ear canals left ear canal and left TM are clear.  Right ear canal reveals a large amount of wax that was occluding the ear canal.  This was removed with suction forceps and curettes.  After cleaning the ear canal the ear canal was otherwise clear with no signs of infection.  TMs were clear bilaterally.. Nasal: External nose without lesions. Clear nasal passages Oral: Oropharynx clear. Neck: No palpable adenopathy or masses Respiratory: Breathing comfortably  Skin: No facial/neck lesions or rash noted.  Cerumen impaction removal  Date/Time: 03/11/2021 4:29 PM Performed by: Rozetta Nunnery, MD Authorized by: Rozetta Nunnery, MD   Consent:    Consent obtained:  Verbal   Consent given by:  Patient   Risks discussed:  Pain and bleeding Procedure details:    Location:  R ear   Procedure type: curette, suction and forceps   Post-procedure details:    Inspection:  TM intact and canal normal   Hearing quality:  Improved   Patient tolerance of procedure:  Tolerated well, no immediate complications Comments:     TMs are clear  bilaterally.  Only the right ear canal was occluded.    Assessment: Right cerumen impaction obstructing the right ear canal.  Plan: This was cleaned in the office.  She will follow-up as needed.  She is having no hearing problems.  Radene Journey, MD

## 2021-03-12 NOTE — Addendum Note (Signed)
Addended by: Melony Overly E on: 03/12/2021 12:52 PM   Modules accepted: Level of Service

## 2021-03-18 DIAGNOSIS — Z6822 Body mass index (BMI) 22.0-22.9, adult: Secondary | ICD-10-CM | POA: Diagnosis not present

## 2021-03-18 DIAGNOSIS — Z01419 Encounter for gynecological examination (general) (routine) without abnormal findings: Secondary | ICD-10-CM | POA: Diagnosis not present

## 2021-03-18 DIAGNOSIS — N952 Postmenopausal atrophic vaginitis: Secondary | ICD-10-CM | POA: Diagnosis not present

## 2021-03-18 DIAGNOSIS — Z1231 Encounter for screening mammogram for malignant neoplasm of breast: Secondary | ICD-10-CM | POA: Diagnosis not present

## 2021-03-18 DIAGNOSIS — M810 Age-related osteoporosis without current pathological fracture: Secondary | ICD-10-CM | POA: Diagnosis not present

## 2021-03-18 DIAGNOSIS — Z01411 Encounter for gynecological examination (general) (routine) with abnormal findings: Secondary | ICD-10-CM | POA: Diagnosis not present

## 2021-03-18 DIAGNOSIS — Z124 Encounter for screening for malignant neoplasm of cervix: Secondary | ICD-10-CM | POA: Diagnosis not present

## 2021-09-02 DIAGNOSIS — J301 Allergic rhinitis due to pollen: Secondary | ICD-10-CM | POA: Diagnosis not present

## 2021-09-02 DIAGNOSIS — R21 Rash and other nonspecific skin eruption: Secondary | ICD-10-CM | POA: Diagnosis not present

## 2021-09-06 ENCOUNTER — Telehealth: Payer: Self-pay | Admitting: Cardiology

## 2021-09-06 DIAGNOSIS — E7800 Pure hypercholesterolemia, unspecified: Secondary | ICD-10-CM

## 2021-09-06 NOTE — Telephone Encounter (Signed)
Spoke to the patient about Dr. Landis Gandy recommendations to work with the [facility].  Verbalized understanding and agreement.

## 2021-09-06 NOTE — Telephone Encounter (Signed)
  Per MyChart appointment request message:  please tell Carly, that I did make an appt with an allergist, like she recommended. I was there thurs. & the DR thinks the combination of the Covid shots& my cholestrol meds is the reason for the the rashes. So I am thinking of going off the pravastain & going back on the Ezetimbe meds & see what happens. I am  also back on  the bone density pill.  will keep you posted . PC

## 2021-09-15 NOTE — Progress Notes (Signed)
Patient ID: Amber Nunez                 DOB: 1951-11-24                    MRN: [id number]     HPI: Amber Nunez is a 70 y.o. female patient referred to lipid clinic by Dr Radford Pax. PMH is significant for WPW s/p accessory pathway ablation x2, HTN, HLD, carotid artery stenosis, and atypical chest pain. Bilateral carotid dopplers in 2020 showed 1-39% bilateral carotid stenosis.  She sent a message in July reporting rash on her face. She was advised to stop ezetimibe to see if her symptoms improved. She sent an update at the end of September reporting that she saw an allergist who thought that combination of COVID vaccines and cholesterol meds resulted in her rash.   Pt presents today in good spirits. Has had a rash on her left cheek and collarbone area - started shortly after getting her COVID vaccines but also started alendronate and pravastatin/ezetimibe at the same time. She reports not being able to eat nuts or berries since getting the COVID vaccine because they make her itchy. She tried stopping her alendronate for 3 months - rash disappeared from her collarbone but not her cheek. Rash on her collarbone was itchy, the one on her face is not. Then restarted alendronate and rash came back on her collarbone - she plans to stop taking this. She tried stopping ezetimibe for the past 3 months and her rash did not change. Plans to resume this and try stopping her pravastatin next. Has 2 creams to use that help with the rash as well. Previously took rosuvastatin in 2020 - caused cough, GI upset, rash on leg, and numb feeling in her leg.  Current Medications: pravastatin 10mg  daily  Intolerances: ezetimibe 10mg  daily - rash? Pravastatin 20mg  daily - blurred vision, constipation, rash, rosuvastatin 10mg  - cough, GI upset, rash on leg, numb feeling in leg Risk Factors: bilateral carotid artery stenosis, FHx of premature CAD LDL goal: 70mg /dL  Diet: Drinks 1/2 cup coffee, water, vitamin water,  minimal alcohol. Avoids dairy - lactose intolerant. Likes chicken and pasta.  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 There is no history of Breast cancer.  Social History: Former tobacco use 1/2 PPD for 10 years, denies drug use, occasional alcohol use.  Labs: 12/17/20: TC 195, TG 64, HDL 93, LDL 90 (pravastatin 10mg  daily, ezetimibe 10mg  daily)  Past Medical History:  Diagnosis Date   Abnormal electrocardiogram (ECG) (EKG)    Carotid artery stenosis 06/19/2019   < 50% bilateral stenosis on dopplers 2018   Essential hypertension, benign    Familial hypercholesterolemia    Hx of dizziness    Hx of syncope    Left wrist fracture    Sinus bradycardia    SVT (supraventricular tachycardia) ([facility])    Wolff-Parkinson-White (WPW) syndrome     Current Outpatient Medications on File Prior to Visit  Medication Sig Dispense Refill   alendronate (FOSAMAX) 70 MG tablet Take 1 tablet by mouth once a week.     aspirin 81 MG tablet Take 81 mg by mouth daily.     Biotin 10000 MCG TABS Take 10,000 mcg by mouth daily.      calcium carbonate (OS-CAL) 600 MG  tablet Take 600 mg by mouth daily.      clobetasol cream (TEMOVATE) 9.89 % Apply 1 application topically as needed. Apply one application of cream 2.11% to chest daily as needed for rash.     Multiple Vitamin (MULTIVITAMIN WITH MINERALS) TABS tablet Take 1 tablet by mouth daily.     multivitamin-lutein (OCUVITE-LUTEIN) CAPS capsule Take 1 capsule by mouth daily.     pravastatin (PRAVACHOL) 20 MG tablet Take 0.5 tablets (10 mg total) by mouth every evening. 45 tablet 2   tacrolimus (PROTOPIC) 0.03 % ointment Apply topically as needed.     vitamin C (ASCORBIC ACID) 500 MG tablet Take 500 mg by mouth daily.     No current facility-administered medications on file prior to visit.     Allergies  Allergen Reactions   Penicillins Nausea And Vomiting   Atorvastatin     cough   Lortab [Hydrocodone-Acetaminophen] Other (See Comments)    Passing out   Zebeta [Bisoprolol Fumarate] Rash    Assessment/Plan:  1. Hyperlipidemia - LDL previously 90 in January on pravastatin 10mg  daily and ezetimibe 10mg  daily, above goal < 70 due to carotid artery stenosis and FHx of CAD. She plans to resume her ezetimibe since rash did not improve with 3 month trial off, and will stop pravastatin to see if this was contributing to the rash on her face. She is also stopping her alendronate again as this seemed to cause the rash on her collarbone. Will try rechallenging with atorvastatin 20mg  daily. Pt does not wish to take injectable cholesterol medications. Could try bempedoic acid in the future. Can recheck labs in January at next cardiology visit. Advised pt to call clinic with any concerns before then.  Amber Nunez, PharmD, BCACP, [facility] [address], [place], [place] [address] Phone: [phone]; Fax: [phone] 09/16/2021 9:35 AM

## 2021-09-16 ENCOUNTER — Other Ambulatory Visit: Payer: Self-pay

## 2021-09-16 ENCOUNTER — Ambulatory Visit: Payer: Medicare HMO | Admitting: Pharmacist

## 2021-09-16 DIAGNOSIS — I6523 Occlusion and stenosis of bilateral carotid arteries: Secondary | ICD-10-CM

## 2021-09-16 DIAGNOSIS — E7800 Pure hypercholesterolemia, unspecified: Secondary | ICD-10-CM

## 2021-09-16 MED ORDER — ATORVASTATIN CALCIUM 20 MG PO TABS: 20.0000 mg | ORAL_TABLET | Freq: Every day | ORAL | 3 refills | Status: DC

## 2021-09-16 NOTE — Patient Instructions (Addendum)
It was nice to meet you today  Your LDL goal is < 70 and was 90 in January  Stop taking pravastatin, resume your ezetimibe  In 2 weeks, start atorvastatin (Lipitor) 20mg  once daily  Call Jinny Blossom, PharmD with any concerns [phone]  Have Dr Radford Pax check fasting cholesterol when you see her in January

## 2021-10-27 ENCOUNTER — Other Ambulatory Visit: Payer: Self-pay | Admitting: Cardiology

## 2021-11-01 ENCOUNTER — Encounter: Payer: Self-pay | Admitting: Pharmacist

## 2021-11-01 MED ORDER — NEXLETOL 180 MG PO TABS: 1.0000 | ORAL_TABLET | Freq: Every day | ORAL | 11 refills | Status: DC

## 2021-11-01 NOTE — Addendum Note (Signed)
Addended by: Hai Grabe E on: 11/01/2021 12:25 PM   Modules accepted: Orders

## 2021-11-22 DIAGNOSIS — H353131 Nonexudative age-related macular degeneration, bilateral, early dry stage: Secondary | ICD-10-CM | POA: Diagnosis not present

## 2021-12-20 DIAGNOSIS — U071 COVID-19: Secondary | ICD-10-CM | POA: Diagnosis not present

## 2021-12-20 DIAGNOSIS — Z1152 Encounter for screening for COVID-19: Secondary | ICD-10-CM | POA: Diagnosis not present

## 2021-12-20 DIAGNOSIS — Z9189 Other specified personal risk factors, not elsewhere classified: Secondary | ICD-10-CM | POA: Diagnosis not present

## 2022-01-06 ENCOUNTER — Ambulatory Visit: Payer: Medicare HMO | Admitting: Cardiology

## 2022-01-07 ENCOUNTER — Ambulatory Visit: Payer: Medicare HMO | Admitting: Cardiology

## 2022-01-10 ENCOUNTER — Encounter: Payer: Self-pay | Admitting: Cardiology

## 2022-01-10 ENCOUNTER — Ambulatory Visit: Payer: Medicare HMO | Admitting: Cardiology

## 2022-01-10 ENCOUNTER — Other Ambulatory Visit: Payer: Self-pay

## 2022-01-10 VITALS — BP 140/80 | HR 66 | Ht 62.0 in | Wt 119.0 lb

## 2022-01-10 DIAGNOSIS — E7800 Pure hypercholesterolemia, unspecified: Secondary | ICD-10-CM

## 2022-01-10 DIAGNOSIS — T466X5A Adverse effect of antihyperlipidemic and antiarteriosclerotic drugs, initial encounter: Secondary | ICD-10-CM

## 2022-01-10 DIAGNOSIS — I6523 Occlusion and stenosis of bilateral carotid arteries: Secondary | ICD-10-CM | POA: Diagnosis not present

## 2022-01-10 DIAGNOSIS — I456 Pre-excitation syndrome: Secondary | ICD-10-CM

## 2022-01-10 DIAGNOSIS — G720 Drug-induced myopathy: Secondary | ICD-10-CM

## 2022-01-10 DIAGNOSIS — R42 Dizziness and giddiness: Secondary | ICD-10-CM

## 2022-01-10 LAB — LIPID PANEL
Chol/HDL Ratio: 2.1 ratio (ref 0.0–4.4)
Cholesterol, Total: 194 mg/dL (ref 100–199)
HDL: 94 mg/dL (ref >39)
LDL Chol Calc (NIH): 91 mg/dL (ref 0–99)
Triglycerides: 46 mg/dL (ref 0–149)
VLDL Cholesterol Cal: 9 mg/dL (ref 5–40)

## 2022-01-10 LAB — ALT: ALT: 21 IU/L (ref 0–32)

## 2022-01-10 NOTE — Addendum Note (Signed)
Addended by: Antonieta Iba on: 01/10/2022 10:19 AM   Modules accepted: Orders

## 2022-01-10 NOTE — Patient Instructions (Signed)
Medication Instructions:  Your physician recommends that you continue on your current medications as directed. Please refer to the Current Medication list given to you today.  *If you need a refill on your cardiac medications before your next appointment, please call your pharmacy*   Lab Work: TODAY: FLP and ALT If you have labs (blood work) drawn today and your tests are completely normal, you will receive your results only by: [place] (if you have MyChart) OR A paper copy in the mail If you have any lab test that is abnormal or we need to change your treatment, we will call you to review the results.   Testing/Procedures: Your physician has requested that you have a carotid duplex. This test is an ultrasound of the carotid arteries in your neck. It looks at blood flow through these arteries that supply the brain with blood. Allow one hour for this exam. There are no restrictions or special instructions.  Follow-Up: At [facility], you and your health needs are our priority.  As part of our continuing mission to provide you with exceptional heart care, we have created designated Provider Care Teams.  These Care Teams include your primary Cardiologist (physician) and Advanced Practice Providers (APPs -  Physician Assistants and Nurse Practitioners) who all work together to provide you with the care you need, when you need it.  Your next appointment:   1 year(s)  The format for your next appointment:   In Person  Provider:   Fransico Him, MD

## 2022-01-10 NOTE — Addendum Note (Signed)
Addended by: Antonieta Iba on: 01/10/2022 10:16 AM   Modules accepted: Orders

## 2022-01-10 NOTE — Progress Notes (Signed)
Cardiology Office Note:    Date:  01/10/2022   ID:  MAKAILAH Nunez, DOB 1951/08/17, MRN [id number]  PCP:  [place]ique, Betty G, MD  Cardiologist:  Fransico Him, MD    Referring MD: [place]ique, Betty G, MD   Chief Complaint  Patient presents with   Follow-up    WPW, carotid stenosis and HLD    History of Present Illness:    Amber Nunez is a 71 y.o. 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   Bilateral carotid dopplers in 2020 showed 1-39% bilateral carotid stenosis.  She is here today for followup and is doing well.  She denies any chest pain or pressure, SOB, DOE, PND, orthopnea, LE edema, palpitations or syncope. Since I saw her she has had a few episodes of dizziness.  The first episode occurred while standing.  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  She drank some water and some sugary food and the sx resolved.  She is compliant with her meds and is tolerating meds with no SE.     Past Medical History:  Diagnosis Date   Abnormal electrocardiogram (ECG) (EKG)    Carotid artery stenosis 06/19/2019   < 50% bilateral stenosis on dopplers 2018   Essential hypertension, benign    Familial hypercholesterolemia    Hx of dizziness    Hx of syncope    Left wrist fracture    Sinus bradycardia    SVT (supraventricular tachycardia) ([facility])    Wolff-Parkinson-White (WPW) syndrome     Past Surgical History:  Procedure Laterality Date   ABLATION  2000   had 2 for WPW   BREAST BIOPSY Right    negative   BREAST SURGERY     BIOPSY, 3 YEARS AGO   MOLE REMOVAL     OVARIES REMOVED      Current Medications: Current Meds  Medication Sig   aspirin 81 MG tablet Take 81 mg by mouth daily.   Bempedoic Acid (NEXLETOL)  180 MG TABS Take 1 tablet by mouth daily.   Biotin 10000 MCG TABS Take 10,000 mcg by mouth daily.    clobetasol cream (TEMOVATE) 6.75 % Apply 1 application topically as needed. Apply one application of cream 9.16% to chest daily as needed for rash.   Multiple Vitamin (MULTIVITAMIN WITH MINERALS) TABS tablet Take 1 tablet by mouth daily.   multivitamin-lutein (OCUVITE-LUTEIN) CAPS capsule Take 1 capsule by mouth daily.   tacrolimus (PROTOPIC) 0.03 % ointment Apply topically as needed.   vitamin C (ASCORBIC ACID) 500 MG tablet Take 500 mg by mouth daily.     Allergies:   Penicillins, Atorvastatin, Ezetimibe, Lortab [hydrocodone-acetaminophen], Pravastatin, Rosuvastatin, and Zebeta [bisoprolol fumarate]   Social History   Socioeconomic History   Marital status: Married    Spouse name: Not on file   Number of children: Not on file   Years of education: Not on file   Highest education level: Not on file  Occupational History   Not on file  Tobacco Use   Smoking status: Former    Packs/day: 0.50    Years: 10.00    Pack years: 5.00    Types: Cigarettes    Quit date:  1990    Years since quitting: 33.1   Smokeless tobacco: Never  Vaping Use   Vaping Use: Never used  Substance and Sexual Activity   Alcohol use: Yes    Comment: One drink per month   Drug use: No   Sexual activity: Never    Partners: Male    Comment: MARRIEDE  Other Topics Concern   Not on file  Social History Narrative   Not on file   Social Determinants of Health   Financial Resource Strain: Low Risk    Difficulty of Paying Living Expenses: Not hard at all  Food Insecurity: No Food Insecurity   Worried About Charity fundraiser in the Last Year: Never true   [place] in the Last Year: Never true  Transportation Needs: No Transportation Needs   Lack of Transportation (Medical): No   Lack of Transportation (Non-Medical): No  Physical Activity: Insufficiently Active   Days of Exercise per Week: 7 days    Minutes of Exercise per Session: 10 min  Stress: No Stress Concern Present   Feeling of Stress : Not at all  Social Connections: Moderately Integrated   Frequency of Communication with Friends and Family: Once a week   Frequency of Social Gatherings with Friends and Family: More than three times a week   Attends Religious Services: More than 4 times per year   Active Member of [employer] or Organizations: No   Attends Music therapist: Never   Marital Status: Married     Family History: The patient's family history includes Cancer in her father; Heart attack (age of onset: 5) in her father; Heart disease in her father and mother; Hyperlipidemia in her brother and father. There is no history of Breast cancer.  ROS:   Please see the history of present illness.    ROS  All other systems reviewed and negative.   EKGs/Labs/Other Studies Reviewed:    The following studies were reviewed today: PAP compliance download  EKG:  EKG is  ordered today.  The ekg ordered today demonstrates NSR with no ST changes  Recent Labs: No results found for requested labs within last 8760 hours.   Recent Lipid Panel    Component Value Date/Time   CHOL 195 12/17/2020 1011   TRIG 64 12/17/2020 1011   HDL 93 12/17/2020 1011   CHOLHDL 2.1 12/17/2020 1011   CHOLHDL 3 01/09/2018 0757   VLDL 16.8 01/09/2018 0757   LDLCALC 90 12/17/2020 1011    Physical Exam:    VS:  BP 140/80 (BP Location: Left Arm, Patient Position: Sitting, Cuff Size: Normal)    Pulse 66    Ht 5\' 2"  (1.575 m)    Wt 119 lb (54 kg)    SpO2 97%    BMI 21.77 kg/m     Wt Readings from Last 3 Encounters:  01/10/22 119 lb (54 kg)  01/28/21 118 lb 1 oz (53.6 kg)  12/10/20 119 lb 3.2 oz (54.1 kg)    GEN: Well nourished, well developed in no acute distress HEENT: Normal NECK: No JVD; No carotid bruits LYMPHATICS: No lymphadenopathy CARDIAC:RRR, no murmurs, rubs, gallops RESPIRATORY:  Clear to auscultation without rales,  wheezing or rhonchi  ABDOMEN: Soft, non-tender, non-distended MUSCULOSKELETAL:  No edema; No deformity  SKIN: Warm and dry NEUROLOGIC:  Alert and oriented x 3 PSYCHIATRIC:  Normal affect   ASSESSMENT:    1. WPW (Wolff-Parkinson-White syndrome)   2. Bilateral carotid artery stenosis   3.  Pure hypercholesterolemia   4. Dizziness     PLAN:    In order of problems listed above:  1.  SVT with WPW -she is maintaining NSR and denies any palpitations  2.  Bilateral carotid artery stenosis -dopplers 06/2019 with 1-39% stenosis -I will repeat dopplers to make sure this is stable -continue ASA and statin  3.  HLD -LDL goal < 70 -check FLP and ALT -continue prescription drug management with Pravastatin 10mg  daily and Zetia 10mg  daily with PRN refills.   4.  Dizziness -suspect this is orthostatic hypotension related to standing for prolonged periods of time at work vs. Low BS -Recommended that she wear compression hose during working hours  Medication Adjustments/Labs and Tests Ordered: Current medicines are reviewed at length with the patient today.  Concerns regarding medicines are outlined above.  Orders Placed This Encounter  Procedures   EKG 12-Lead   No orders of the defined types were placed in this encounter.   Signed, Fransico Him, MD  01/10/2022 10:10 AM    [place]

## 2022-01-11 ENCOUNTER — Telehealth: Payer: Self-pay

## 2022-01-11 DIAGNOSIS — E7800 Pure hypercholesterolemia, unspecified: Secondary | ICD-10-CM

## 2022-01-11 NOTE — Telephone Encounter (Signed)
-----   Message from Sueanne Margarita, MD sent at 01/10/2022  7:11 PM EST ----- LDL not at goal - he is statin intolerant and has not tolerated higher doses of pravastatin - refer to lipid clinic

## 2022-01-11 NOTE — Addendum Note (Signed)
Addended by: Ma Hillock on: 01/11/2022 10:31 AM   Modules accepted: Orders

## 2022-01-11 NOTE — Telephone Encounter (Signed)
Called pt to discuss LDL and Turner's recommendation to see Lipid Clinic. Pt states that she was here in November and saw Jinny Blossom and was started on : Bempedoic Acid (NEXLETOL) 180 MG TABS 30 tablet 11 11/01/2021   Sig:   Take 1 tablet by mouth daily.    Route:   Oral     Per pt she had discussed with Jinny Blossom that she could re-start her pravastatin 10mg  if her next blood work showed it was necessary. Pt states that the intolerance she had encountered in the past was primarily constipation and she states that she can manage that. Questioned pt about the rash and blurred vision reactions that we have on file and per pt she has returned to the eye doctor and her vision had changed and she needed new corrective lenses. The rash she states she is still dealing with, but has an appt at the dermatologist next month and is currently stopping different products she uses daily to determine if any of these could be causing it. Pt states she DOES want to try to restart her Pravastatin before coming into the lipid clinic and will let us know if she is unable to take it for any reason. Informed pt that I will send this info to Turner and PharmD and that she likely will be contacted for repeat labs in 3 months to evaluate if addition of Pravastatin is working.

## 2022-01-11 NOTE — Telephone Encounter (Signed)
LDL 91 on Nexletol 180mg  daily, LDL goal is < 70. She reported prior side effects with rosuvastatin, atorvastatin, pravastatin, and ezetimibe, and is opposed to injectable medication. That is fine if she wishes to rechallenge with pravastatin, she had a rash in the past that she was trying to determine if it was medication-related or not. She can restart at pravastatin 10mg  or 20mg  daily depending on her preference but should continue on Nexletol as well and have follow up lipid panel rechecked in 6 weeks.

## 2022-01-11 NOTE — Telephone Encounter (Signed)
FYI

## 2022-01-20 ENCOUNTER — Ambulatory Visit (HOSPITAL_BASED_OUTPATIENT_CLINIC_OR_DEPARTMENT_OTHER)
Admission: RE | Admit: 2022-01-20 | Discharge: 2022-01-20 | Disposition: A | Discharge status: Home / Self Care | Payer: Medicare HMO | Source: Ambulatory Visit | Attending: Cardiology | Admitting: Cardiology

## 2022-01-20 ENCOUNTER — Other Ambulatory Visit: Payer: Self-pay

## 2022-01-20 DIAGNOSIS — I6523 Occlusion and stenosis of bilateral carotid arteries: Secondary | ICD-10-CM

## 2022-01-20 DIAGNOSIS — I456 Pre-excitation syndrome: Secondary | ICD-10-CM

## 2022-01-20 DIAGNOSIS — E7800 Pure hypercholesterolemia, unspecified: Secondary | ICD-10-CM

## 2022-01-20 DIAGNOSIS — R42 Dizziness and giddiness: Secondary | ICD-10-CM

## 2022-02-03 ENCOUNTER — Encounter: Payer: Self-pay | Admitting: Physician Assistant

## 2022-02-03 ENCOUNTER — Ambulatory Visit: Payer: Medicare HMO | Admitting: Physician Assistant

## 2022-02-03 ENCOUNTER — Other Ambulatory Visit: Payer: Self-pay

## 2022-02-03 DIAGNOSIS — Z86018 Personal history of other benign neoplasm: Secondary | ICD-10-CM

## 2022-02-03 DIAGNOSIS — C44319 Basal cell carcinoma of skin of other parts of face: Secondary | ICD-10-CM

## 2022-02-03 DIAGNOSIS — D485 Neoplasm of uncertain behavior of skin: Secondary | ICD-10-CM

## 2022-02-03 DIAGNOSIS — Z85820 Personal history of malignant melanoma of skin: Secondary | ICD-10-CM

## 2022-02-03 DIAGNOSIS — Z1283 Encounter for screening for malignant neoplasm of skin: Secondary | ICD-10-CM

## 2022-02-03 DIAGNOSIS — L570 Actinic keratosis: Secondary | ICD-10-CM | POA: Diagnosis not present

## 2022-02-03 DIAGNOSIS — C44310 Basal cell carcinoma of skin of unspecified parts of face: Secondary | ICD-10-CM

## 2022-02-03 NOTE — Patient Instructions (Addendum)
Biopsy, Surgery (Curettage) & Surgery (Excision) Aftercare Instructions  1. Okay to remove bandage in 24 hours  2. Wash area with soap and water  3. Apply Vaseline to area twice daily until healed (Not Neosporin)  4. Okay to cover with a Band-Aid to decrease the chance of infection or prevent irritation from clothing; also it's okay to uncover lesion at home.  5. Suture instructions: return to our office in 7-10 or 10-14 days for a nurse visit for suture removal. Variable healing with sutures, if pain or itching occurs call our office. It's okay to shower or bathe 24 hours after sutures are given.  6. The following risks may occur after a biopsy, curettage or excision: bleeding, scarring, discoloration, recurrence, infection (redness, yellow drainage, pain or swelling).  7. For questions, concerns and results call our office at [place] before 4pm & Friday before 3pm. Biopsy results will be available in 1 week.       Mohs Surgery Mohs surgery is a procedure that is done to treat skin cancer. It is often used to treat common types of skin cancer, such as basal cell carcinoma and squamous cell carcinoma. In this procedure, cancerous skin cells are carefully cut away layer by layer. The goal is to remove all cancerous tissue and leave healthy skin. This reduces scarring and allows for a better cosmetic outcome. Mohs surgery is used to treat skin cancer in areas where it is important to save as much of the normal skin as possible. These areas include the face, nose, ears, lips, hands, and genitals. This procedure may be done if: Your skin cancer has come back after another type of treatment was done. The cancer is likely to return. The cancerous area is large. The cancerous area has edges that are not clearly defined. The cancer is growing rapidly. Tell a health care provider about: Any allergies you have. All medicines you are taking, including vitamins, herbs, eye drops, creams,  and over-the-counter medicines. Any problems you or family members have had with anesthetic medicines. Any bleeding problems you have. Any surgeries you have had. Any medical conditions you have. Whether you are pregnant or may be pregnant. What are the risks? Generally, this is a safe procedure. However, problems may occur, including: Infection. Bleeding. Allergic reactions to medicines. Damage to other structures, such as nerves. What happens before the procedure? Medicines Ask your health care provider about: Changing or stopping your regular medicines. This is especially important if you are taking diabetes medicines or blood thinners. Taking medicines such as aspirin and ibuprofen. These medicines can thin your blood. Do not take these medicines unless your health care provider tells you to take them. Taking over-the-counter medicines, vitamins, herbs, and supplements. Surgery safety Ask your health care provider: How your surgery site will be marked. What steps will be taken to help prevent infection. These steps may include: Removing hair at the surgery site. Washing skin with a germ-killing soap. Taking antibiotic medicine. What happens during the procedure? You will be given a medicine to numb the area (local anesthetic). A layer of cancerous tissue will be removed with a scalpel. The layer removed will contain a small amount of the healthy tissue surrounding the cancerous tissue. The layer of removed tissue will be checked right away under a microscope. The surgeon will note the exact location of the cancerous cells. Another layer of tissue may be removed from an area with any remaining cancerous cells. This layer will be checked in the same way.  More layers of cancerous tissue may be removed, one by one, and checked until no signs of cancer remain. Depending on the size and location of the surgical wound, it may be closed with stitches (sutures) or left open to heal on its  own. In some cases, a skin flap or skin graft may be needed. A bandage (dressing) will be applied to the area. The procedure may vary among health care providers and hospitals. What happens after the procedure? Return to your normal activities as told by your health care provider. Summary Mohs surgery is a procedure used to treat skin cancer on the face, ears, nose, lips, and genitals. It removes the cancerous cells while leaving as much healthy skin as possible. Generally, this is a safe procedure. However, problems may occur, including infection, bleeding, and damage to other structures, such as nerves. Follow your health care provider's instructions before the procedure. You may be asked to change or stop certain medicines. After the procedure, you may return to your normal activities as told by your health care provider. This information is not intended to replace advice given to you by your health care provider. Make sure you discuss any questions you have with your health care provider. Document Revised: 04/20/2021 Document Reviewed: 04/20/2021 Elsevier Patient Education  [place].

## 2022-02-10 ENCOUNTER — Telehealth: Payer: Self-pay

## 2022-02-10 ENCOUNTER — Encounter: Payer: Self-pay | Admitting: Pharmacist

## 2022-02-10 ENCOUNTER — Encounter: Payer: Self-pay | Admitting: Physician Assistant

## 2022-02-10 NOTE — Telephone Encounter (Signed)
-----   Message from Warren Danes, [place] sent at 02/08/2022  4:12 PM EST ----- ?mohs ?

## 2022-02-10 NOTE — Telephone Encounter (Signed)
Phone call to patient with her pathology results. Voicemail left for patient to give the office a call back.  

## 2022-02-14 IMAGING — US US CAROTID DUPLEX BILAT
1 series · 13 of 24 positions shown · non-contrast
Comparison: None.

CLINICAL DATA: Carotid atherosclerosis, hyperlipidemia, syncope,
history of a wrist via

EXAM:
BILATERAL CAROTID DUPLEX ULTRASOUND
TECHNIQUE: Gray scale imaging, color Doppler and duplex ultrasound were
performed of bilateral carotid and vertebral arteries in the neck.

[Series 1: us carotid bilateral · 13 of 55 slices shown]
[im 1/55]
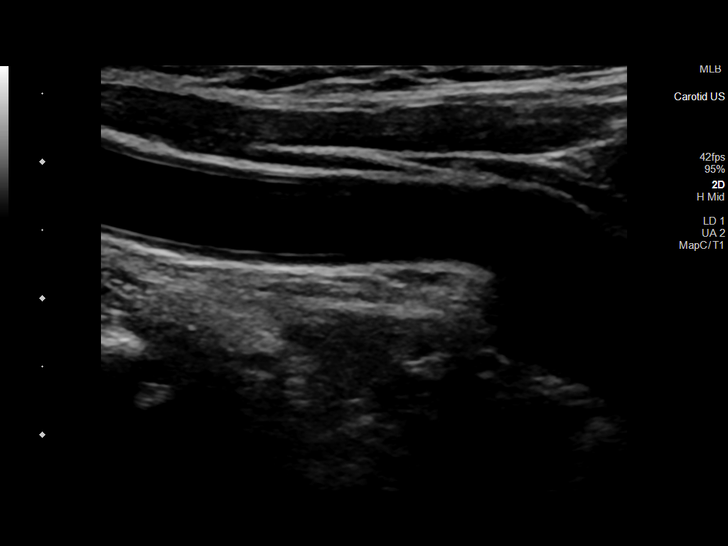
[im 5/55]
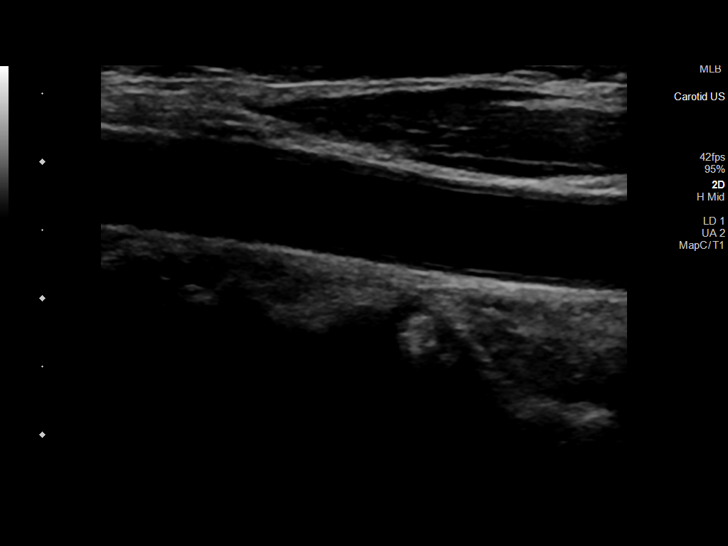
[im 10/55]
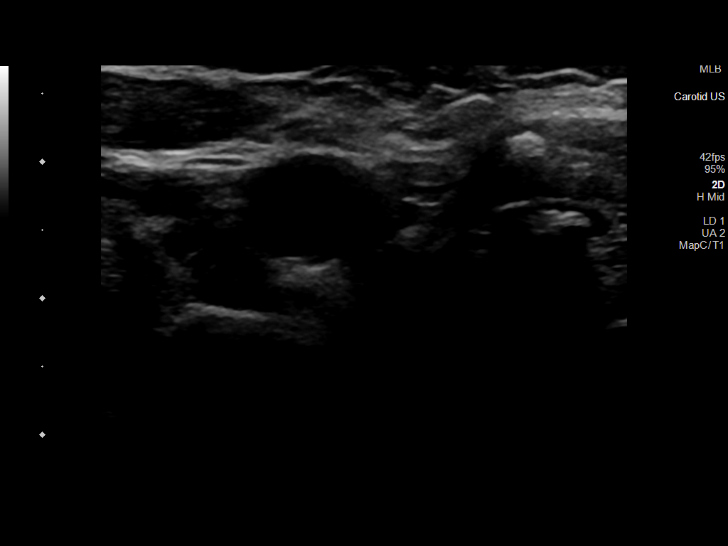
[im 15/55]
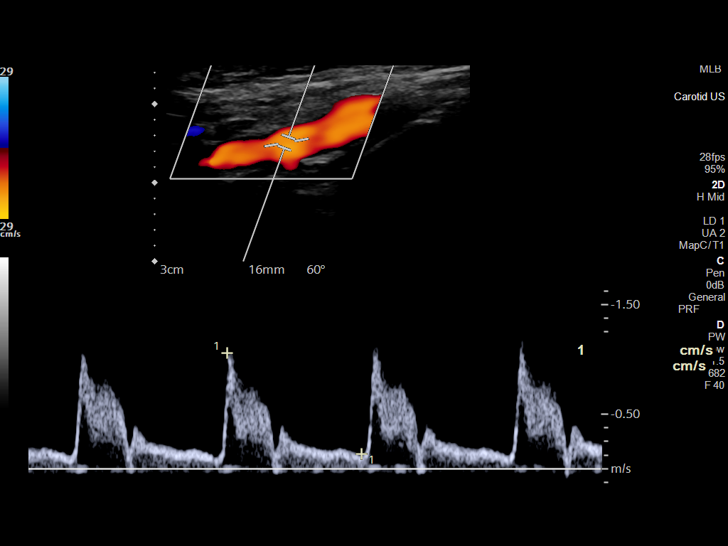
[im 19/55]
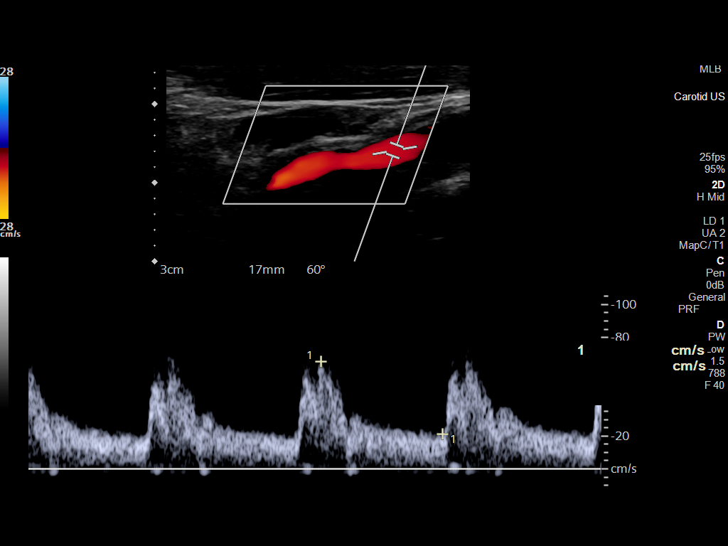
[im 24/55]
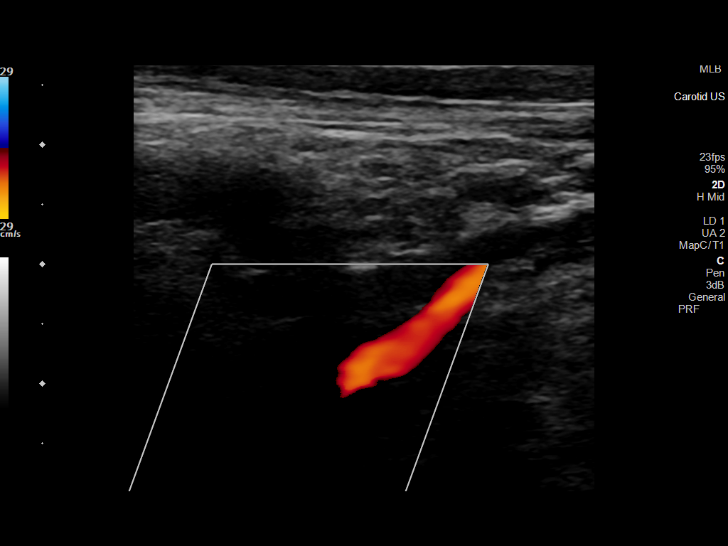
[im 29/55]
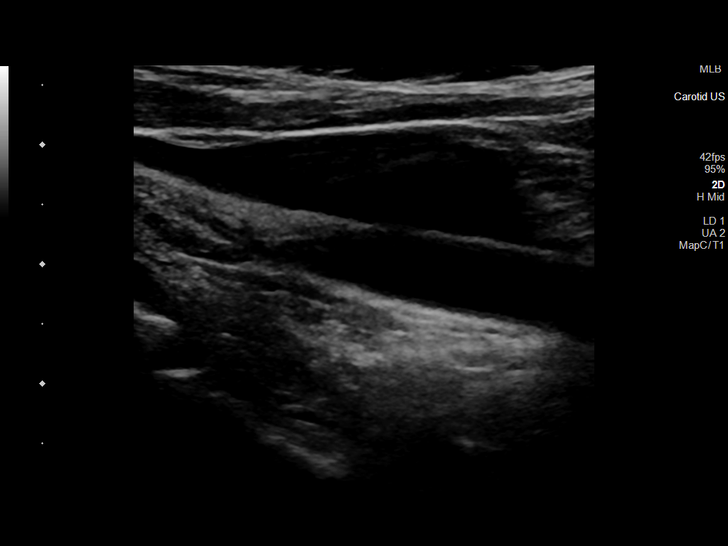
[im 31/55]
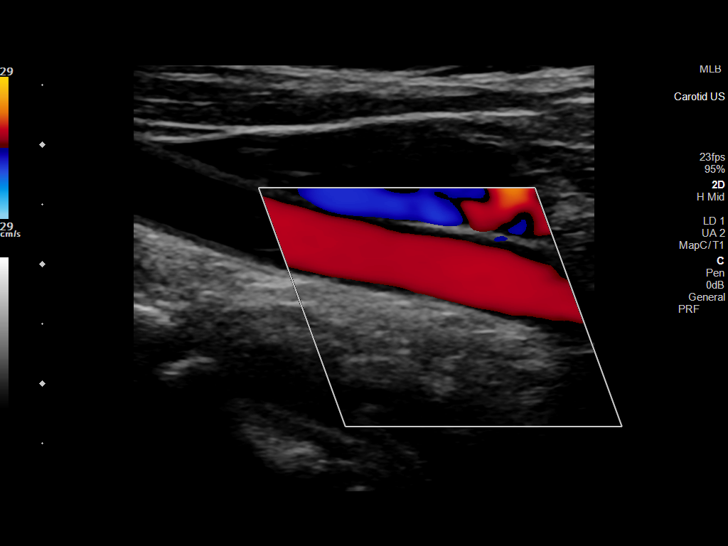
[im 36/55]
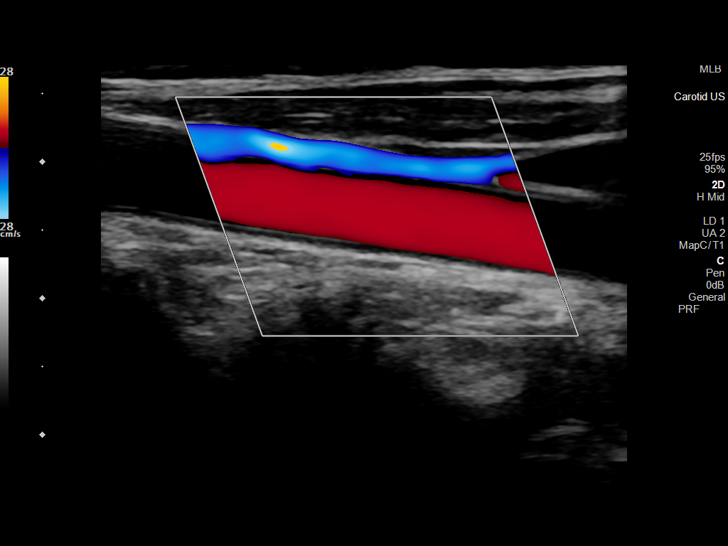
[im 40/55]
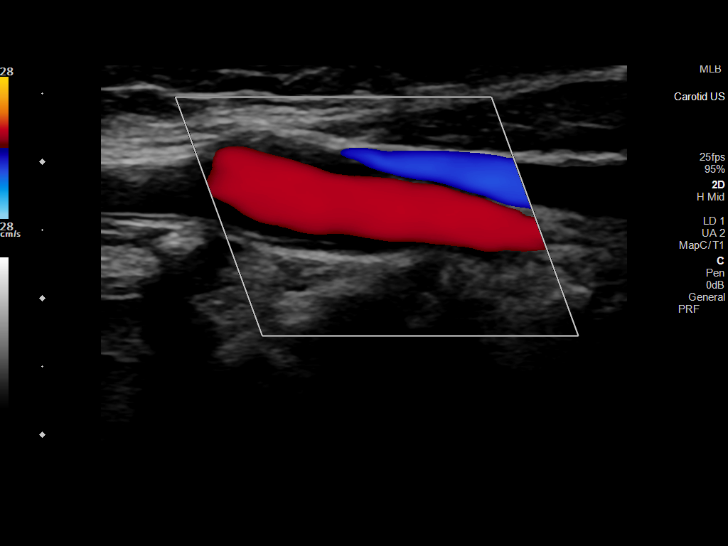
[im 45/55]
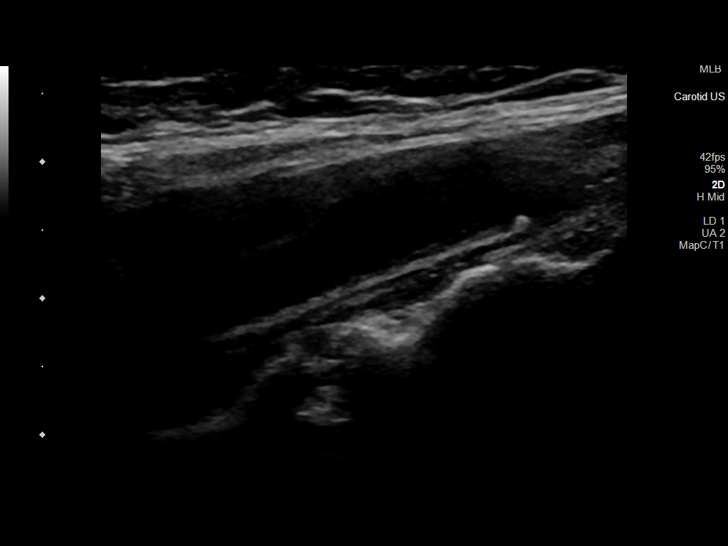
[im 50/55]
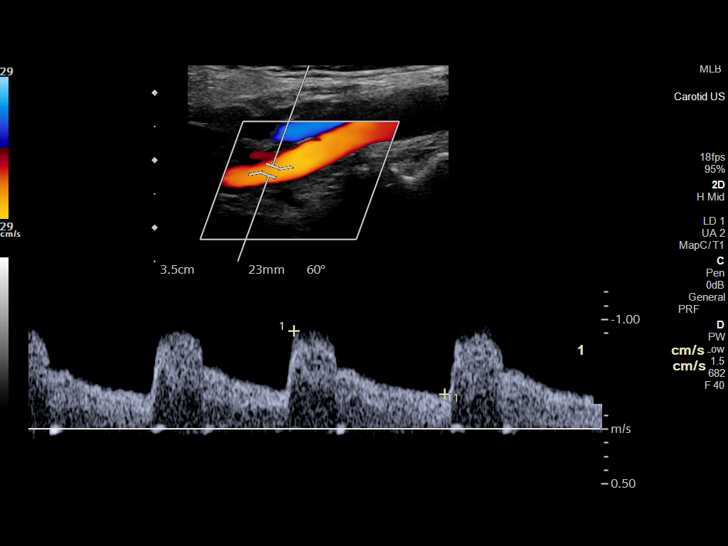
[im 55/55]
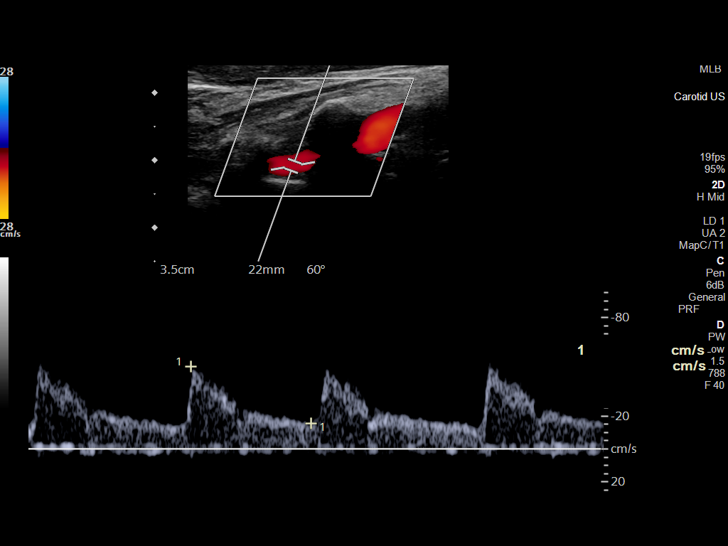

[13 of 24 positions shown; findings below may reference images not displayed]

FINDINGS: Criteria: Quantification of carotid stenosis is based on velocity
parameters that correlate the residual internal carotid diameter
with NASCET-based stenosis levels, using the diameter of the distal
internal carotid lumen as the denominator for stenosis measurement.

The following velocity measurements were obtained:

RIGHT

ICA: 77/28 cm/sec

CCA: 84/17 cm/sec

SYSTOLIC ICA/CCA RATIO:

ECA: 105 cm/sec

LEFT

ICA: 89/31 cm/sec

CCA: 81/18 cm/sec

SYSTOLIC ICA/CCA RATIO:

ECA: 78 cm/sec

RIGHT CAROTID ARTERY: Very minimal thin trace atherosclerosis.
Negative for stenosis, velocity elevation, turbulent flow. No
waveform abnormality. Degree of narrowing less than 50% by
ultrasound criteria.

RIGHT VERTEBRAL ARTERY:  Normal antegrade flow

LEFT CAROTID ARTERY: Very minimal trace atherosclerosis. Negative
for stenosis, velocity elevation, turbulent flow. Degree of
narrowing also less than 50% by ultrasound criteria.

LEFT VERTEBRAL ARTERY:  Normal antegrade flow
IMPRESSION: Negative for significant stenosis. Degree of narrowing less than 50%
bilaterally by ultrasound criteria.

Patent antegrade vertebral flow bilaterally

## 2022-02-14 NOTE — Telephone Encounter (Signed)
Phone call to patient with her pathology results. Patient aware, MOH's info sent. ?

## 2022-02-22 ENCOUNTER — Encounter: Payer: Self-pay | Admitting: Physician Assistant

## 2022-03-24 DIAGNOSIS — M810 Age-related osteoporosis without current pathological fracture: Secondary | ICD-10-CM | POA: Diagnosis not present

## 2022-03-24 DIAGNOSIS — Z124 Encounter for screening for malignant neoplasm of cervix: Secondary | ICD-10-CM | POA: Diagnosis not present

## 2022-03-24 DIAGNOSIS — Z01419 Encounter for gynecological examination (general) (routine) without abnormal findings: Secondary | ICD-10-CM | POA: Diagnosis not present

## 2022-03-24 DIAGNOSIS — Z01411 Encounter for gynecological examination (general) (routine) with abnormal findings: Secondary | ICD-10-CM | POA: Diagnosis not present

## 2022-03-24 DIAGNOSIS — Z1231 Encounter for screening mammogram for malignant neoplasm of breast: Secondary | ICD-10-CM | POA: Diagnosis not present

## 2022-03-24 DIAGNOSIS — Z6823 Body mass index (BMI) 23.0-23.9, adult: Secondary | ICD-10-CM | POA: Diagnosis not present

## 2022-03-31 DIAGNOSIS — C44319 Basal cell carcinoma of skin of other parts of face: Secondary | ICD-10-CM | POA: Diagnosis not present

## 2022-05-31 DIAGNOSIS — G720 Drug-induced myopathy: Secondary | ICD-10-CM | POA: Insufficient documentation

## 2022-06-01 ENCOUNTER — Other Ambulatory Visit (HOSPITAL_BASED_OUTPATIENT_CLINIC_OR_DEPARTMENT_OTHER): Payer: Self-pay | Admitting: Obstetrics

## 2022-06-01 DIAGNOSIS — M810 Age-related osteoporosis without current pathological fracture: Secondary | ICD-10-CM

## 2022-06-20 ENCOUNTER — Ambulatory Visit (HOSPITAL_BASED_OUTPATIENT_CLINIC_OR_DEPARTMENT_OTHER)
Admission: RE | Admit: 2022-06-20 | Discharge: 2022-06-20 | Disposition: A | Discharge status: Home / Self Care | Payer: Medicare HMO | Source: Ambulatory Visit | Attending: Obstetrics | Admitting: Obstetrics

## 2022-06-20 DIAGNOSIS — M810 Age-related osteoporosis without current pathological fracture: Secondary | ICD-10-CM | POA: Diagnosis not present

## 2022-06-20 DIAGNOSIS — Z780 Asymptomatic menopausal state: Secondary | ICD-10-CM | POA: Diagnosis not present

## 2022-06-20 DIAGNOSIS — M8589 Other specified disorders of bone density and structure, multiple sites: Secondary | ICD-10-CM | POA: Diagnosis not present

## 2022-06-22 ENCOUNTER — Encounter: Payer: Self-pay | Admitting: Cardiology

## 2022-06-22 MED ORDER — PRAVASTATIN SODIUM 10 MG PO TABS: 10.0000 mg | ORAL_TABLET | Freq: Every day | ORAL | 3 refills | Status: DC

## 2022-07-11 ENCOUNTER — Encounter: Payer: Self-pay | Admitting: Physician Assistant

## 2022-09-12 ENCOUNTER — Encounter: Payer: Self-pay | Admitting: Pharmacist

## 2022-09-12 ENCOUNTER — Encounter (HOSPITAL_BASED_OUTPATIENT_CLINIC_OR_DEPARTMENT_OTHER): Payer: Self-pay | Admitting: Cardiology

## 2022-09-12 DIAGNOSIS — E7800 Pure hypercholesterolemia, unspecified: Secondary | ICD-10-CM

## 2022-09-12 MED ORDER — EZETIMIBE 10 MG PO TABS: 10.0000 mg | ORAL_TABLET | Freq: Every day | ORAL | 3 refills | Status: DC

## 2022-12-19 ENCOUNTER — Other Ambulatory Visit: Payer: Self-pay

## 2022-12-19 ENCOUNTER — Ambulatory Visit: Payer: Medicare HMO | Attending: Cardiology

## 2022-12-19 DIAGNOSIS — E785 Hyperlipidemia, unspecified: Secondary | ICD-10-CM

## 2022-12-19 DIAGNOSIS — Z79899 Other long term (current) drug therapy: Secondary | ICD-10-CM | POA: Diagnosis not present

## 2022-12-19 DIAGNOSIS — H353131 Nonexudative age-related macular degeneration, bilateral, early dry stage: Secondary | ICD-10-CM | POA: Diagnosis not present

## 2022-12-21 ENCOUNTER — Telehealth: Payer: Self-pay

## 2022-12-21 DIAGNOSIS — E7800 Pure hypercholesterolemia, unspecified: Secondary | ICD-10-CM

## 2022-12-21 DIAGNOSIS — G720 Drug-induced myopathy: Secondary | ICD-10-CM

## 2022-12-21 LAB — LIPID PANEL
Chol/HDL Ratio: 2.2 ratio (ref 0.0–4.4)
Cholesterol, Total: 197 mg/dL (ref 100–199)
HDL: 90 mg/dL (ref >39)
LDL Chol Calc (NIH): 95 mg/dL (ref 0–99)
Triglycerides: 63 mg/dL (ref 0–149)
VLDL Cholesterol Cal: 12 mg/dL (ref 5–40)

## 2022-12-21 LAB — APOLIPOPROTEIN B: Apolipoprotein B: 75 mg/dL (ref <90)

## 2022-12-21 LAB — ALT: ALT: 16 IU/L (ref 0–32)

## 2022-12-21 NOTE — Telephone Encounter (Signed)
Discussed results with patient who agrees to re-referral to lipid clinic. Order placed.

## 2023-01-11 DIAGNOSIS — R102 Pelvic and perineal pain: Secondary | ICD-10-CM | POA: Diagnosis not present

## 2023-01-11 DIAGNOSIS — K5790 Diverticulosis of intestine, part unspecified, without perforation or abscess without bleeding: Secondary | ICD-10-CM | POA: Diagnosis not present

## 2023-01-11 DIAGNOSIS — Z1211 Encounter for screening for malignant neoplasm of colon: Secondary | ICD-10-CM | POA: Diagnosis not present

## 2023-01-11 DIAGNOSIS — Z86010 Personal history of colonic polyps: Secondary | ICD-10-CM | POA: Diagnosis not present

## 2023-01-11 NOTE — Progress Notes (Unsigned)
Office Visit    Patient Name: Amber Nunez Date of Encounter: 01/12/2023  PCP:  [place], Betty G, [facility]  Cardiologist:  Fransico Him, MD  Advanced Practice Provider:  No care team member to display Electrophysiologist:  None   HPI    Amber Nunez is a 72 y.o. 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  She was last seen 12/2021 and was doing well at that time.  She denies any chest pain or pressure, SOB, DOE, PND, orthopnea, LE edema, palpitations, or syncope.  She did have a few episodes of dizziness.  1 episode occurred while standing.  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  She drank some water and ate some sugary food and it resolved.  Compliant with meds.  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  We discussed her most recent labs where her LDL was 95.  She tells me she has an appointment with pharmacy later this month to discuss lowering her LDL less than 70.  She has been on the higher dose of pravastatin in the past so we may need to increase her back up to 20 mg.  EKG reviewed with patient without any issues.  She has been doing well since her ablation x 2 other than an occasional feeling of vibration.  She states that it usually only lasts a few seconds and is not particularly worrisome.  Reports no shortness of breath nor dyspnea on exertion. Reports no chest pain, pressure, or tightness. No edema, orthopnea, PND. Reports  no palpitations.    Past Medical History    Past Medical History:  Diagnosis Date   Abnormal electrocardiogram (ECG) (EKG)    Carotid artery stenosis 06/19/2019   < 50% bilateral stenosis on dopplers 2018   Essential hypertension, benign    Familial hypercholesterolemia    Hx of dizziness    Hx of syncope    Left wrist fracture    Sinus bradycardia    SVT (supraventricular tachycardia)    Wolff-Parkinson-White (WPW) syndrome    Past Surgical History:  Procedure Laterality Date   ABLATION  2000   had 2 for WPW   BREAST BIOPSY Right    negative   BREAST SURGERY     BIOPSY, 3 YEARS AGO   MOLE REMOVAL     OVARIES REMOVED      Allergies  Allergies  Allergen Reactions   Penicillins Nausea And Vomiting   Atorvastatin     Joint/muscle pain   Ezetimibe     rash   Lortab [Hydrocodone-Acetaminophen] Other (See Comments)    Passing out   Pravastatin     Blurred vision, constipation, rash with '20mg'$  dose   Rosuvastatin     Cough, GI upset, rash on leg   Zebeta [Bisoprolol Fumarate] Rash    EKGs/Labs/Other Studies Reviewed:   The following studies were reviewed today: Carotid US 01/20/22  EXAM: BILATERAL CAROTID DUPLEX ULTRASOUND   TECHNIQUE: Pearline Cables scale imaging,  color Doppler and duplex ultrasound were performed of bilateral carotid and vertebral arteries in the neck.   COMPARISON:  None.   FINDINGS: Criteria: Quantification of carotid stenosis is based on velocity parameters that correlate the residual internal carotid diameter with NASCET-based stenosis levels, using the diameter of the distal internal carotid lumen as the denominator for stenosis measurement.   The following velocity measurements were obtained:   RIGHT   ICA: 77/28 cm/sec   CCA: 20/94 cm/sec   SYSTOLIC ICA/CCA RATIO:  0.9   ECA: 105 cm/sec   LEFT   ICA: 89/31 cm/sec   CCA: 70/96 cm/sec   SYSTOLIC ICA/CCA RATIO:  1.1   ECA: 78 cm/sec   RIGHT CAROTID ARTERY: Very minimal thin  trace atherosclerosis. Negative for stenosis, velocity elevation, turbulent flow. No waveform abnormality. Degree of narrowing less than 50% by ultrasound criteria.   RIGHT VERTEBRAL ARTERY:  Normal antegrade flow   LEFT CAROTID ARTERY: Very minimal trace atherosclerosis. Negative for stenosis, velocity elevation, turbulent flow. Degree of narrowing also less than 50% by ultrasound criteria.   LEFT VERTEBRAL ARTERY:  Normal antegrade flow   IMPRESSION: Negative for significant stenosis. Degree of narrowing less than 50% bilaterally by ultrasound criteria.   Patent antegrade vertebral flow bilaterally  EKG:  EKG is  ordered today.  The ekg ordered today demonstrates NSR, rate 70 bpm  Recent Labs: 12/19/2022: ALT 16  Recent Lipid Panel    Component Value Date/Time   CHOL 197 12/19/2022 1011   TRIG 63 12/19/2022 1011   HDL 90 12/19/2022 1011   CHOLHDL 2.2 12/19/2022 1011   CHOLHDL 3 01/09/2018 0757   VLDL 16.8 01/09/2018 0757   LDLCALC 95 12/19/2022 1011    Home Medications   Current Meds  Medication Sig   alendronate (FOSAMAX) 70 MG tablet Take 70 mg by mouth once a week. Take with a full glass of water on an empty stomach.   aspirin 81 MG tablet Take 81 mg by mouth daily.   Biotin 10000 MCG TABS Take 10,000 mcg by mouth daily.    clobetasol cream (TEMOVATE) 2.83 % Apply 1 application topically as needed. Apply one application of cream 6.62% to chest daily as needed for rash.   ezetimibe (ZETIA) 10 MG tablet Take 1 tablet (10 mg total) by mouth daily.   Multiple Vitamin (MULTIVITAMIN WITH MINERALS) TABS tablet Take 1 tablet by mouth daily.   multivitamin-lutein (OCUVITE-LUTEIN) CAPS capsule Take 1 capsule by mouth daily.   pravastatin (PRAVACHOL) 10 MG tablet Take 1 tablet (10 mg total) by mouth daily.   tacrolimus (PROTOPIC) 0.03 % ointment Apply topically as needed.   vitamin C (ASCORBIC ACID) 500 MG tablet Take 500 mg by mouth daily.     Review of Systems      All  other systems reviewed and are otherwise negative except as noted above.  Physical Exam    VS:  BP 130/80   Pulse 70   Ht '5\' 2"'$  (1.575 m)   Wt 117 lb 6.4 oz (53.3 kg)   SpO2 97%   BMI 21.47 kg/m  , BMI Body mass index is 21.47 kg/m.  Wt Readings from Last 3 Encounters:  01/12/23 117 lb 6.4 oz (53.3 kg)  01/10/22 119 lb (54 kg)  01/28/21 118 lb 1 oz (53.6 kg)     GEN: Well nourished, well developed, in no acute distress. HEENT: normal. Neck: Supple, no JVD, carotid bruits, or masses. Cardiac: RRR, no murmurs, rubs, or gallops. No clubbing,  cyanosis, edema.  Radials/PT 2+ and equal bilaterally.  Respiratory:  Respirations regular and unlabored, clear to auscultation bilaterally. GI: Soft, nontender, nondistended. MS: No deformity or atrophy. Skin: Warm and dry, no rash. Neuro:  Strength and sensation are intact. Psych: Normal affect.  Assessment & Plan    SVT with WPW -She is s/p ablation -sometimes she gets a warm flash but no palpitations -feels like she is vibrating, nothing regular and lasts for only a few seconds -continue current medications aspirin 81 mg daily, Zetia 10 mg daily, pravastatin 10 mg daily  Bilateral carotid artery stenosis -[place] was done 2/23 -may need a repeat at next appointment  HLD -Continue pravastatin 10 mg daily and Zetia 10 mg daily -Appointment with Pharm.D. at the end of the month for further medication titration -Would recommend a lipid panel at this time  Dizziness -resolved with the use of compression hose while standing for long periods of time.       Disposition: Follow up 1 year with Fransico Him, MD or APP.  Signed, Elgie Collard, PA-C 01/12/2023, 11:25 AM [facility]

## 2023-01-12 ENCOUNTER — Encounter: Payer: Self-pay | Admitting: Physician Assistant

## 2023-01-12 ENCOUNTER — Ambulatory Visit: Payer: Medicare HMO | Attending: Physician Assistant | Admitting: Physician Assistant

## 2023-01-12 VITALS — BP 130/80 | HR 70 | Ht 62.0 in | Wt 117.4 lb

## 2023-01-12 DIAGNOSIS — R42 Dizziness and giddiness: Secondary | ICD-10-CM | POA: Diagnosis not present

## 2023-01-12 DIAGNOSIS — I456 Pre-excitation syndrome: Secondary | ICD-10-CM | POA: Diagnosis not present

## 2023-01-12 DIAGNOSIS — I6523 Occlusion and stenosis of bilateral carotid arteries: Secondary | ICD-10-CM | POA: Diagnosis not present

## 2023-01-12 DIAGNOSIS — Z79899 Other long term (current) drug therapy: Secondary | ICD-10-CM

## 2023-01-12 NOTE — Patient Instructions (Addendum)
Medication Instructions:  Your physician recommends that you continue on your current medications as directed. Please refer to the Current Medication list given to you today.  *If you need a refill on your cardiac medications before your next appointment, please call your pharmacy*   Lab Work: None ordered If you have labs (blood work) drawn today and your tests are completely normal, you will receive your results only by: [place] (if you have MyChart) OR A paper copy in the mail If you have any lab test that is abnormal or we need to change your treatment, we will call you to review the results.   Follow-Up: At [facility], you and your health needs are our priority.  As part of our continuing mission to provide you with exceptional heart care, we have created designated Provider Care Teams.  These Care Teams include your primary Cardiologist (physician) and Advanced Practice Providers (APPs -  Physician Assistants and Nurse Practitioners) who all work together to provide you with the care you need, when you need it.   Your next appointment:   1 year(s)  Provider:   Fransico Him, MD    Keep 02/02/23 appointment with the pharmacist here in our office at 2:30 PM.

## 2023-02-02 ENCOUNTER — Ambulatory Visit: Payer: Medicare HMO | Attending: Physician Assistant | Admitting: Pharmacist

## 2023-02-02 DIAGNOSIS — E7800 Pure hypercholesterolemia, unspecified: Secondary | ICD-10-CM

## 2023-02-02 DIAGNOSIS — T466X5A Adverse effect of antihyperlipidemic and antiarteriosclerotic drugs, initial encounter: Secondary | ICD-10-CM | POA: Diagnosis not present

## 2023-02-02 DIAGNOSIS — G720 Drug-induced myopathy: Secondary | ICD-10-CM | POA: Diagnosis not present

## 2023-02-02 MED ORDER — PRAVASTATIN SODIUM 20 MG PO TABS: 20.0000 mg | ORAL_TABLET | Freq: Every evening | ORAL | 3 refills | Status: DC

## 2023-02-02 NOTE — Progress Notes (Signed)
Patient ID: Amber Nunez                 DOB: 1951-07-17                    MRN: [id number]      HPI: Amber Nunez is a 72 y.o. female patient referred to lipid clinic by  Dr. Radford Pax. PMH is significant for WPW s/p accessory pathway ablation x2, HTN, HLD, carotid artery stenosis, and atypical chest pain. Bilateral carotid dopplers in 2020 showed 1-39% bilateral carotid stenosis.   Patient was previously seen in lipid clinic in October 2022.  At that time it was thought that she was having a rash from ezetimibe.  She was tried on atorvastatin but developed muscle aches.  She was started on Nexletol.  Patient has refused injections.  Her LDL-C was 91 on just Nexletol.  Patient decided to rechallenge with pravastatin 10 mg. Later determined that her rash was not from ezetimibe but it was basal cell carcinoma.  In October 2023 patient requested to stop Nexletol due to cost and go back to ezetimibe.  Most recent LDL-C is 95, APO B 75.  Patient presents today to lipid clinic. She reports recent stress in her life that she attributes to her higher cholesterol labs. Reports she has lost weigh recently and is watching her diet. She did well on Nexletol, but cost was an issue. Concerned about her nails breaking and her hair thinning. Also has some red spots on her face. Advised I do not think this is from her medications. She has apt with dermatology in April.  Current Medications: Pravastatin 10 mg daily, ezetimibe 10 mg daily Intolerances: atorvastatin (joint/muscle pain), pravastatin (blurred vision constipation and rash with 20 mg dose), rosuvastatin 26m - cough, GI upset, rash on leg, numb feeling in leg  Risk Factors: CAD, carotid artery disease, HTN LDL-C goal: <70 ApoB goal: <80  Social History:  Social History   Socioeconomic History   Marital status: Married    Spouse name: Not on file   Number of children: Not on file   Years of education: Not on file   Highest education  level: Not on file  Occupational History   Not on file  Tobacco Use   Smoking status: Former    Packs/day: 0.50    Years: 10.00    Total pack years: 5.00    Types: Cigarettes    Quit date: 1990    Years since quitting: 34.1   Smokeless tobacco: Never  Vaping Use   Vaping Use: Never used  Substance and Sexual Activity   Alcohol use: Yes    Comment: One drink per month   Drug use: No   Sexual activity: Never    Partners: Male    Comment: MARRIEDE  Other Topics Concern   Not on file  Social History Narrative   Not on file   Social Determinants of Health   Financial Resource Strain: Low Risk  (01/28/2021)   Overall Financial Resource Strain (CARDIA)    Difficulty of Paying Living Expenses: Not hard at all  Food Insecurity: No Food Insecurity (01/28/2021)   Hunger Vital Sign    Worried About Running Out of Food in the Last Year: Never true    RTerminousin the Last Year: Never true  Transportation Needs: No Transportation Needs (01/28/2021)   PRAPARE - THydrologist(Medical): No    Lack of Transportation (  Non-Medical): No  Physical Activity: Insufficiently Active (01/28/2021)   Exercise Vital Sign    Days of Exercise per Week: 7 days    Minutes of Exercise per Session: 10 min  Stress: No Stress Concern Present (01/28/2021)   [place]    Feeling of Stress : Not at all  Social Connections: Moderately Integrated (01/28/2021)   Social Connection and Isolation Panel [NHANES]    Frequency of Communication with Friends and Family: Once a week    Frequency of Social Gatherings with Friends and Family: More than three times a week    Attends Religious Services: More than 4 times per year    Active Member of Clubs or Organizations: No    Attends Archivist Meetings: Never    Marital Status: Married  Human resources officer Violence: Not At Risk (01/28/2021)   Humiliation, Afraid,  Rape, and Kick questionnaire    Fear of Current or Ex-Partner: No    Emotionally Abused: No    Physically Abused: No    Sexually Abused: No     Labs: Lipid Panel     Component Value Date/Time   CHOL 197 12/19/2022 1011   TRIG 63 12/19/2022 1011   HDL 90 12/19/2022 1011   CHOLHDL 2.2 12/19/2022 1011   CHOLHDL 3 01/09/2018 0757   VLDL 16.8 01/09/2018 0757   [place] 95 12/19/2022 1011   LABVLDL 12 12/19/2022 1011    Past Medical History:  Diagnosis Date   Abnormal electrocardiogram (ECG) (EKG)    Carotid artery stenosis 06/19/2019   < 50% bilateral stenosis on dopplers 2018   Essential hypertension, benign    Familial hypercholesterolemia    Hx of dizziness    Hx of syncope    Left wrist fracture    Sinus bradycardia    SVT (supraventricular tachycardia)    Wolff-Parkinson-White (WPW) syndrome     Current Outpatient Medications on File Prior to Visit  Medication Sig Dispense Refill   alendronate (FOSAMAX) 70 MG tablet Take 70 mg by mouth once a week. Take with a full glass of water on an empty stomach.     aspirin 81 MG tablet Take 81 mg by mouth daily.     Biotin 10000 MCG TABS Take 10,000 mcg by mouth daily.      clobetasol cream (TEMOVATE) [id number] % Apply 1 application topically as needed. Apply one application of cream [id number] to chest daily as needed for rash.     ezetimibe (ZETIA) 10 MG tablet Take 1 tablet (10 mg total) by mouth daily. 90 tablet 3   Multiple Vitamin (MULTIVITAMIN WITH MINERALS) TABS tablet Take 1 tablet by mouth daily.     multivitamin-lutein (OCUVITE-LUTEIN) CAPS capsule Take 1 capsule by mouth daily.     tacrolimus (PROTOPIC) 0.03 % ointment Apply topically as needed.     vitamin C (ASCORBIC ACID) 500 MG tablet Take 500 mg by mouth daily.     No current facility-administered medications on file prior to visit.    Allergies  Allergen Reactions   Penicillins Nausea And Vomiting   Atorvastatin     Joint/muscle pain   Ezetimibe     rash   Lortab  [Hydrocodone-Acetaminophen] Other (See Comments)    Passing out   Pravastatin     Blurred vision, constipation, rash with 62m dose   Rosuvastatin     Cough, GI upset, rash on leg   Zebeta [Bisoprolol Fumarate] Rash    Assessment/Plan:  1. Hyperlipidemia -  Hyperlipidemia Assessment: Patient tolerating pravastatin and ezetimibe 26m daily Tolerated Nexletol well, but concerned about cost, even with the [employer] grant Patient wishes to increase pravastatin The last time she went to [facility] to have labs done, they told her she had to have a provider in that office- will look into this  Plan: Increase pravastatin to 243mdaily Continue ezetimibe 1052maily Recheck labs in 2-3 months. I do not think LDL-C will be at goal, but very limited medication options   Thank you,  MelRamond Dialharm.D, BCPS, CPP [facility] [id number]: [phone]: [phone]

## 2023-02-02 NOTE — Assessment & Plan Note (Signed)
Assessment: Patient tolerating pravastatin and ezetimibe 22m daily Tolerated Nexletol well, but concerned about cost, even with the [employer] grant Patient wishes to increase pravastatin The last time she went to [facility] to have labs done, they told her she had to have a provider in that office- will look into this  Plan: Increase pravastatin to 271mdaily Continue ezetimibe 1031maily Recheck labs in 2-3 months. I do not think LDL-C will be at goal, but very limited medication options

## 2023-02-02 NOTE — Patient Instructions (Addendum)
Please increase pravastatin 41m daily Continue ezetimibe 138mdaily Once I figure out the issue with [facility] we will coordinate labs

## 2023-03-27 DIAGNOSIS — Z1231 Encounter for screening mammogram for malignant neoplasm of breast: Secondary | ICD-10-CM | POA: Diagnosis not present

## 2023-03-27 DIAGNOSIS — M810 Age-related osteoporosis without current pathological fracture: Secondary | ICD-10-CM | POA: Diagnosis not present

## 2023-03-27 DIAGNOSIS — Z01419 Encounter for gynecological examination (general) (routine) without abnormal findings: Secondary | ICD-10-CM | POA: Diagnosis not present

## 2023-03-27 DIAGNOSIS — Z6822 Body mass index (BMI) 22.0-22.9, adult: Secondary | ICD-10-CM | POA: Diagnosis not present

## 2023-03-27 DIAGNOSIS — Z124 Encounter for screening for malignant neoplasm of cervix: Secondary | ICD-10-CM | POA: Diagnosis not present

## 2023-03-27 DIAGNOSIS — Z01411 Encounter for gynecological examination (general) (routine) with abnormal findings: Secondary | ICD-10-CM | POA: Diagnosis not present

## 2023-03-27 LAB — HM MAMMOGRAPHY

## 2023-03-30 DIAGNOSIS — L821 Other seborrheic keratosis: Secondary | ICD-10-CM | POA: Diagnosis not present

## 2023-03-30 DIAGNOSIS — L570 Actinic keratosis: Secondary | ICD-10-CM | POA: Diagnosis not present

## 2023-04-24 ENCOUNTER — Encounter: Payer: Self-pay | Admitting: Nurse Practitioner

## 2023-04-24 ENCOUNTER — Ambulatory Visit: Payer: Medicare HMO | Admitting: Nurse Practitioner

## 2023-04-24 VITALS — BP 128/80 | HR 97 | Ht 60.75 in | Wt 114.2 lb

## 2023-04-24 DIAGNOSIS — R0789 Other chest pain: Secondary | ICD-10-CM

## 2023-04-24 DIAGNOSIS — M542 Cervicalgia: Secondary | ICD-10-CM

## 2023-04-24 DIAGNOSIS — Z86006 Personal history of melanoma in-situ: Secondary | ICD-10-CM

## 2023-04-24 DIAGNOSIS — E7800 Pure hypercholesterolemia, unspecified: Secondary | ICD-10-CM

## 2023-04-24 DIAGNOSIS — C439 Malignant melanoma of skin, unspecified: Secondary | ICD-10-CM

## 2023-04-24 DIAGNOSIS — M62838 Other muscle spasm: Secondary | ICD-10-CM

## 2023-04-24 DIAGNOSIS — M810 Age-related osteoporosis without current pathological fracture: Secondary | ICD-10-CM

## 2023-04-24 DIAGNOSIS — Z8679 Personal history of other diseases of the circulatory system: Secondary | ICD-10-CM

## 2023-04-24 DIAGNOSIS — M79672 Pain in left foot: Secondary | ICD-10-CM | POA: Diagnosis not present

## 2023-04-24 MED ORDER — CYCLOBENZAPRINE HCL 5 MG PO TABS: 5.0000 mg | ORAL_TABLET | Freq: Three times a day (TID) | ORAL | 1 refills | Status: DC | PRN

## 2023-04-24 MED ORDER — ALENDRONATE SODIUM 70 MG PO TABS
70.0000 mg | ORAL_TABLET | ORAL | 3 refills | Status: DC
Start: 2023-04-24 — End: 2024-05-30

## 2023-04-24 NOTE — Progress Notes (Signed)
Tollie Eth, DNP, AGNP-c [facility] [address] [place], [place] [address] Main Office [phone]   New patient visit   Patient: Amber Nunez   DOB: 28-Feb-1951   72 y.o. Female  MRN: [id number] Visit Date: 04/24/2023  Patient Care Team: Judyann Casasola, Sung Amabile, NP as PCP - General (Nurse Practitioner) Quintella Reichert, MD as PCP - Cardiology (Cardiology)  Today's Vitals   04/24/23 0837  BP: 128/80  Pulse: 97  Weight: 114 lb 3.2 oz (51.8 kg)  Height: 5' 0.75" (1.543 m)   Body mass index is 21.76 kg/m.   Today's healthcare provider: Tollie Eth, NP   Chief Complaint  Patient presents with   other    New pt. Est. Also pinched nerve in neck, 5-6 weeks pinched nerve in neck, also goes down the arm on lt. Side burning sensation, also has some ache in chest possible pulled muscle there.    Subjective    LAVENIA BUJOLD is a 72 y.o. female who presents today as a new patient to establish care.    Caretha presents today to establish care and discuss several health concerns.   She reports a persistent issue with her neck, describing a burning sensation that occasionally extends down her arm and into her thumb and first finger. This symptom has been ongoing for approximately five to six weeks and is exacerbated by certain movements, particularly when her arm is manipulated during blood draws. She suspects stress may contribute to the stiffness and discomfort in her neck and shoulder area. Brigett expresses concerns about her shoulder, wondering if it might be impingement syndrome, and notes that she has not experienced any recent trauma or significant changes in activity that could explain her symptoms.   Additionally, Sidra mentions an aching sensation in her chest, which she believes might be related to a muscle she previously pulled. This discomfort is triggered by specific movements and is not constant. She relates this to a past incident confirmed by  her cardiologist as a pulled muscle.  Lynita also discusses a recent issue with her left lateral foot, which began about two weeks ago. She experiences discomfort, which she attributes to her compression stockings and possibly her footwear. She notes a callus formation on her toe, which is tender to touch. She works as a Theatre stage manager, which requires prolonged standing, contributing to her foot discomfort.  Regarding her medical history, Kaicee mentions a history of Wolfe-Parkinson-White syndrome, for which she underwent two ablations with no subsequent issues. She also had a lobectomy at the age of 75. Currently, she experiences occasional warm flashes and an electrical sensation, which she does not describe as palpitations but more like an electrical charge.  Leylanie is currently on Fosamax 70 mg, prescribed by her gynecologist, and she uses compression stockings recommended by her cardiologist to manage symptoms of dizziness and circulation issues after work. She also takes aspirin as advised by her cardiologist and is considering changing cardiologists due to accessibility issues.  She is proactive about her health, maintaining a medical journal and trying a home remedy mix of vinegar, lemon juice, honey, ginger juice, and garlic juice to aid in cholesterol management and circulation, which she has been using for about a month and a half.   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   Carotid artery stenosis 06/19/2019   <  50% bilateral stenosis on dopplers 2018   Essential hypertension, benign    Familial hypercholesterolemia    Hx of dizziness    Hx of syncope    Left wrist fracture    Osteoporosis 03/11/2021   Sinus bradycardia    SVT (supraventricular tachycardia)    Wolff-Parkinson-White (WPW) syndrome    Past Surgical History:  Procedure Laterality Date   ABDOMINAL  HYSTERECTOMY  3/4 left overary removed   ABLATION  2000   had 2 for WPW   BREAST BIOPSY Right    negative   BREAST SURGERY     BIOPSY, 3 YEARS AGO   FRACTURE SURGERY  LEFT WRIST   MOLE REMOVAL     OVARIES REMOVED     Family Status  Relation Name Status   Mother Myriam Jacobson Deceased   Father Rosanne Ashing Deceased   Brother JAMIE Alive   MGM  Deceased   MGF  Deceased   PGM  Deceased   PGF  Deceased   Brother BRIAN Alive   Neg Hx  (Not Specified)   Family History  Problem Relation Age of Onset   Heart disease Mother        CAD   Arthritis Mother    Heart disease Father    Hyperlipidemia Father    Heart attack Father 4       CABG X 3   Cancer Father    Hyperlipidemia Brother    Hyperlipidemia Brother    Breast cancer Neg Hx    Social History   Socioeconomic History   Marital status: Married    Spouse name: Not on file   Number of children: Not on file   Years of education: Not on file   Highest education level: Not on file  Occupational History   Not on file  Tobacco Use   Smoking status: Former    Packs/day: 0.50    Years: 5.00    Additional pack years: 0.00    Total pack years: 2.50    Types: Cigarettes    Quit date: 12/12/1988    Years since quitting: 34.4   Smokeless tobacco: Never   Tobacco comments:    quit over 40 yrs ago  Vaping Use   Vaping Use: Never used  Substance and Sexual Activity   Alcohol use: Yes    Comment: occasionly might have 1 beer   Drug use: No   Sexual activity: Not Currently    Partners: Male    Comment: MARRIEDE  Other Topics Concern   Not on file  Social History Narrative   Not on file   Social Determinants of Health   Financial Resource Strain: Low Risk  (01/28/2021)   Overall Financial Resource Strain (CARDIA)    Difficulty of Paying Living Expenses: Not hard at all  Food Insecurity: No Food Insecurity (01/28/2021)   Hunger Vital Sign    Worried About Running Out of Food in the Last Year: Never true    Ran Out of Food in the  Last Year: Never true  Transportation Needs: No Transportation Needs (01/28/2021)   PRAPARE - Administrator, Civil Service (Medical): No    Lack of Transportation (Non-Medical): No  Physical Activity: Insufficiently Active (01/28/2021)   Exercise Vital Sign    Days of Exercise per Week: 7 days    Minutes of Exercise per Session: 10 min  Stress: No Stress Concern Present (01/28/2021)   [employer] of Occupational Health - Occupational Stress Questionnaire    Feeling  of Stress : Not at all  Social Connections: Moderately Integrated (01/28/2021)   Social Connection and Isolation Panel [NHANES]    Frequency of Communication with Friends and Family: Once a week    Frequency of Social Gatherings with Friends and Family: More than three times a week    Attends Religious Services: More than 4 times per year    Active Member of [employer] or Organizations: No    Attends Banker Meetings: Never    Marital Status: Married   Outpatient Medications Prior to Visit  Medication Sig Note   aspirin 81 MG tablet Take 81 mg by mouth daily.    Biotin 09811 MCG TABS Take 10,000 mcg by mouth daily.     cholecalciferol (VITAMIN D3) 25 MCG (1000 UNIT) tablet Take 1,000 Units by mouth daily.    co-enzyme Q-10 30 MG capsule Take 30 mg by mouth 3 (three) times daily.    ezetimibe (ZETIA) 10 MG tablet Take 1 tablet (10 mg total) by mouth daily.    Multiple Vitamin (MULTIVITAMIN WITH MINERALS) TABS tablet Take 1 tablet by mouth daily.    multivitamin-lutein (OCUVITE-LUTEIN) CAPS capsule Take 1 capsule by mouth daily.    pravastatin (PRAVACHOL) 20 MG tablet Take 1 tablet (20 mg total) by mouth every evening.    tacrolimus (PROTOPIC) 0.03 % ointment Apply topically as needed.    vitamin C (ASCORBIC ACID) 500 MG tablet Take 500 mg by mouth daily.    vitamin k 100 MCG tablet Take 100 mcg by mouth daily.    [DISCONTINUED] alendronate (FOSAMAX) 70 MG tablet Take 70 mg by mouth once a week. Take  with a full glass of water on an empty stomach.    clobetasol cream (TEMOVATE) 0.05 % Apply 1 application topically as needed. Apply one application of cream 0.05% to chest daily as needed for rash. (Patient not taking: Reported on 04/24/2023) 04/24/2023: prn   No facility-administered medications prior to visit.   Allergies  Allergen Reactions   Penicillins Nausea And Vomiting   Atorvastatin     Joint/muscle pain   Erythromycin Other (See Comments)   Hydrocodone-Acetaminophen Other (See Comments)    Passing out  Other Reaction(s): dizziness   Rosuvastatin Other (See Comments)    Cough, GI upset, rash on leg   Zebeta [Bisoprolol Fumarate] Rash   Immunization History  Administered Date(s) Administered   PFIZER Comirnaty(Gray Top)Covid-19 Tri-Sucrose Vaccine 01/02/2021   PFIZER(Purple Top)SARS-COV-2 Vaccination 02/10/2020, 03/07/2020, 03/28/2020    Health Maintenance Due Health Maintenance Topics with due status: Overdue     Topic Date Due   Hepatitis C Screening Never done   DTaP/Tdap/Td Never done   Zoster Vaccines- Shingrix Never done   Pneumonia Vaccine 68+ Years old Never done   MAMMOGRAM 05/01/2021   Medicare Annual Wellness (AWV) 01/28/2022   COVID-19 Vaccine 08/12/2022    Review of Systems All review of systems negative except what is listed in the HPI   Objective    BP 128/80   Pulse 97   Ht 5' 0.75" (1.543 m)   Wt 114 lb 3.2 oz (51.8 kg)   BMI 21.76 kg/m  Physical Exam Vitals and nursing note reviewed.  Constitutional:      Appearance: Normal appearance.  HENT:     Head: Normocephalic.  Eyes:     General: No scleral icterus.    Conjunctiva/sclera: Conjunctivae normal.  Neck:     Vascular: No carotid bruit.  Cardiovascular:     Rate and Rhythm: Normal  rate and regular rhythm.     Pulses: Normal pulses.     Heart sounds: Normal heart sounds.  Pulmonary:     Effort: Pulmonary effort is normal.     Breath sounds: Normal breath sounds.  Abdominal:      Palpations: Abdomen is soft.  Musculoskeletal:        General: Tenderness and signs of injury present.       Arms:     Cervical back: Tenderness present.     Right lower leg: No edema.     Left lower leg: No edema.  Skin:    General: Skin is warm and dry.     Capillary Refill: Capillary refill takes less than 2 seconds.  Neurological:     General: No focal deficit present.     Mental Status: She is alert and oriented to person, place, and time.     Motor: Weakness present.  Psychiatric:        Mood and Affect: Mood normal.     Results for orders placed or performed in visit on 04/24/23  CBC with Differential/Platelet  Result Value Ref Range   WBC 5.2 3.4 - 10.8 x10E3/uL   RBC 4.79 3.77 - 5.28 x10E6/uL   Hemoglobin 14.1 11.1 - 15.9 g/dL   Hematocrit 16.1 09.6 - 46.6 %   MCV 90 79 - 97 fL   MCH 29.4 26.6 - 33.0 pg   MCHC 32.6 31.5 - 35.7 g/dL   RDW 04.5 40.9 - 81.1 %   Platelets 180 150 - 450 x10E3/uL   Neutrophils 72 Not Estab. %   Lymphs 19 Not Estab. %   Monocytes 5 Not Estab. %   Eos 3 Not Estab. %   Basos 1 Not Estab. %   Neutrophils Absolute 3.7 1.4 - 7.0 x10E3/uL   Lymphocytes Absolute 1.0 0.7 - 3.1 x10E3/uL   Monocytes Absolute 0.3 0.1 - 0.9 x10E3/uL   EOS (ABSOLUTE) 0.1 0.0 - 0.4 x10E3/uL   Basophils Absolute 0.0 0.0 - 0.2 x10E3/uL   Immature Granulocytes 0 Not Estab. %   Immature Grans (Abs) 0.0 0.0 - 0.1 x10E3/uL  Comprehensive metabolic panel  Result Value Ref Range   Glucose 88 70 - 99 mg/dL   BUN 25 8 - 27 mg/dL   Creatinine, Ser 9.14 0.57 - 1.00 mg/dL   eGFR 88 >78 GN/FAO/1.30   BUN/Creatinine Ratio 34 (H) 12 - 28   Sodium 143 134 - 144 mmol/L   Potassium 4.3 3.5 - 5.2 mmol/L   Chloride 102 96 - 106 mmol/L   CO2 23 20 - 29 mmol/L   Calcium 9.7 8.7 - 10.3 mg/dL   Total Protein 7.5 6.0 - 8.5 g/dL   Albumin 4.9 (H) 3.8 - 4.8 g/dL   Globulin, Total 2.6 1.5 - 4.5 g/dL   Albumin/Globulin Ratio 1.9 1.2 - 2.2   Bilirubin Total 0.5 0.0 - 1.2 mg/dL    Alkaline Phosphatase 53 44 - 121 IU/L   AST 31 0 - 40 IU/L   ALT 15 0 - 32 IU/L  Lipid panel  Result Value Ref Range   Cholesterol, Total 186 100 - 199 mg/dL   Triglycerides 67 0 - 149 mg/dL   HDL 91 >86 mg/dL   VLDL Cholesterol Cal 12 5 - 40 mg/dL   LDL Chol Calc (NIH) 83 0 - 99 mg/dL   Chol/HDL Ratio 2.0 0.0 - 4.4 ratio    Assessment & Plan      Problem List Items Addressed  This Visit     Hyperlipidemia    History of elevated lipids with difficulty tolerating statin therapy due to myalgias. Currently she is taking pravastatin and ezetimibe without issue.  Plan: - Continue your current medication management.  - Monitor dietary choices to include low saturated fat and carbohydrate options with high fiber.       Relevant Orders   CBC with Differential/Platelet (Completed)   Comprehensive metabolic panel (Completed)   Lipid panel (Completed)   History of melanoma in situ    History of malignant melanoma in 2016 with removal from the left forearm.  Plan: - Referral to dermatology placed today for evaluation and monitoring.       Cervicalgia - Primary    Tamiko presents with symptoms suggestive of cervical radiculopathy, characterized by intermittent burning sensations radiating down her left arm, occasional stiffness, and rare episodes of numbness in her thumb and first finger. These symptoms have been ongoing for approximately five to six weeks and are exacerbated by certain movements and positions. The description of her symptoms aligns with nerve impingement, possibly at the C6-C7 level, contributing to her discomfort.  Plan: - Initiate conservative management with neck stretching exercises to alleviate muscle tension and potentially reduce nerve impingement. - Prescribe Cyclobenzaprine to be taken at bedtime to help relax the muscles and ease symptoms. - Recommend watching progressive muscle relaxation videos on YouTube to further aid in muscle relaxation and stress  relief. - Follow-up in a few weeks to reassess symptoms and consider further diagnostic imaging (e.g., X-ray of the neck) if no improvement is noted.       History of Wolff-Parkinson-White (WPW) syndrome    Shahada has a history of ablation with no current concerns.   Plan: - Continue to monitor closely for any symptoms and report immediately.       Relevant Orders   CBC with Differential/Platelet (Completed)   Comprehensive metabolic panel (Completed)   Lipid panel (Completed)   Osteoporosis    Currently exercising and taking Fosamax for management. No concerns or fractures.  Plan: - Refills provided today.  - Continue current treatment.       Relevant Medications   cholecalciferol (VITAMIN D3) 25 MCG (1000 UNIT) tablet   vitamin k 100 MCG tablet   alendronate (FOSAMAX) 70 MG tablet   Right-sided chest wall pain    Nyja reports aching in the chest area, particularly when moving in certain ways, suggesting costochondritis or a muscle strain. This discomfort is localized and does not seem to be exacerbated by cardiovascular issues.  Plan: - Advise Larisa to monitor the symptoms and return if they worsen or do not improve, to rule out other potential causes and possibly adjust treatment.      Relevant Medications   cyclobenzaprine (FLEXERIL) 5 MG tablet   Foot pain, left    Eddye experiences discomfort in her left foot, which she attributes to the use of compression stockings and possibly ill-fitting shoes. She notes a callus formation and slight tenderness, which may be due to changes in foot shape and prolonged periods of standing.  Plan: - Suggest trying shoes with a wider toe box and possibly gel inserts to alleviate pressure and discomfort. - Recommend using a frozen water bottle to ice the foot after work to reduce inflammation. - Consider a referral to a podiatrist if changes in footwear do not lead to improvement.      Other Visit Diagnoses     Spasm of cervical  paraspinous muscle  Relevant Medications   cyclobenzaprine (FLEXERIL) 5 MG tablet   Other Relevant Orders   CBC with Differential/Platelet (Completed)   Comprehensive metabolic panel (Completed)   Lipid panel (Completed)        No follow-ups on file.     Time: 65  minutes, >50% spent counseling, care coordination, chart review, and documentation.   Carrine Kroboth, Sung Amabile, NP, DNP, AGNP-C [facility]

## 2023-04-24 NOTE — Patient Instructions (Addendum)
Look for a sneaker with a wider toe box and good cushioning on the bottom to help with the pain in your left foot. You can also look for a gel pad that goes under the ball area of the feet for extra padding.   You can try freezing a water bottle and rolling your foot over the bottle to help with pain and any swelling that may come up.   Dr. Duke Salvia or Dr. Cristal Deer at [facility]. Their NP is Gillian Shields. They are wonderful.   I recommend doing the neck stretches first thing in the morning and again at bedtime daily. You can do them more often if you would like, it won't hurt. If you do these for 2-3 weeks and don't notice a difference, please send me a message or call and let me know and we can send you to orthopedics or get an x-ray to look at the neck.   Look up progressive muscle relaxation on youtube and see if you can find a video that you like to follow for meditation to see if that helps with the neck stress and tension.   I have also sent in a muscle relaxer to try at bedtime for a few days and see if this helps with some of the tension.

## 2023-04-25 LAB — COMPREHENSIVE METABOLIC PANEL WITH GFR
ALT: 15 IU/L (ref 0–32)
AST: 31 IU/L (ref 0–40)
Albumin/Globulin Ratio: 1.9 (ref 1.2–2.2)
Albumin: 4.9 g/dL — ABNORMAL HIGH (ref 3.8–4.8)
Alkaline Phosphatase: 53 IU/L (ref 44–121)
BUN/Creatinine Ratio: 34 — ABNORMAL HIGH (ref 12–28)
BUN: 25 mg/dL (ref 8–27)
Bilirubin Total: 0.5 mg/dL (ref 0.0–1.2)
CO2: 23 mmol/L (ref 20–29)
Calcium: 9.7 mg/dL (ref 8.7–10.3)
Chloride: 102 mmol/L (ref 96–106)
Creatinine, Ser: 0.73 mg/dL (ref 0.57–1.00)
Globulin, Total: 2.6 g/dL (ref 1.5–4.5)
Glucose: 88 mg/dL (ref 70–99)
Potassium: 4.3 mmol/L (ref 3.5–5.2)
Sodium: 143 mmol/L (ref 134–144)
Total Protein: 7.5 g/dL (ref 6.0–8.5)
eGFR: 88 mL/min/1.73

## 2023-04-25 LAB — LIPID PANEL
Chol/HDL Ratio: 2 ratio (ref 0.0–4.4)
Cholesterol, Total: 186 mg/dL (ref 100–199)
HDL: 91 mg/dL (ref >39)
LDL Chol Calc (NIH): 83 mg/dL (ref 0–99)
Triglycerides: 67 mg/dL (ref 0–149)
VLDL Cholesterol Cal: 12 mg/dL (ref 5–40)

## 2023-04-25 LAB — CBC WITH DIFFERENTIAL/PLATELET
Basophils Absolute: 0 x10E3/uL (ref 0.0–0.2)
Basos: 1 %
EOS (ABSOLUTE): 0.1 x10E3/uL (ref 0.0–0.4)
Eos: 3 %
Hematocrit: 43.2 % (ref 34.0–46.6)
Hemoglobin: 14.1 g/dL (ref 11.1–15.9)
Immature Grans (Abs): 0 x10E3/uL (ref 0.0–0.1)
Immature Granulocytes: 0 %
Lymphocytes Absolute: 1 x10E3/uL (ref 0.7–3.1)
Lymphs: 19 %
MCH: 29.4 pg (ref 26.6–33.0)
MCHC: 32.6 g/dL (ref 31.5–35.7)
MCV: 90 fL (ref 79–97)
Monocytes Absolute: 0.3 x10E3/uL (ref 0.1–0.9)
Monocytes: 5 %
Neutrophils Absolute: 3.7 x10E3/uL (ref 1.4–7.0)
Neutrophils: 72 %
Platelets: 180 x10E3/uL (ref 150–450)
RBC: 4.79 x10E6/uL (ref 3.77–5.28)
RDW: 13 % (ref 11.7–15.4)
WBC: 5.2 x10E3/uL (ref 3.4–10.8)

## 2023-05-10 ENCOUNTER — Encounter: Payer: Self-pay | Admitting: Nurse Practitioner

## 2023-05-10 DIAGNOSIS — L570 Actinic keratosis: Secondary | ICD-10-CM | POA: Insufficient documentation

## 2023-05-10 DIAGNOSIS — L821 Other seborrheic keratosis: Secondary | ICD-10-CM | POA: Insufficient documentation

## 2023-05-10 DIAGNOSIS — M79672 Pain in left foot: Secondary | ICD-10-CM | POA: Insufficient documentation

## 2023-05-10 DIAGNOSIS — R0789 Other chest pain: Secondary | ICD-10-CM | POA: Insufficient documentation

## 2023-05-10 NOTE — Assessment & Plan Note (Signed)
Currently exercising and taking Fosamax for management. No concerns or fractures.  Plan: - Refills provided today.  - Continue current treatment.

## 2023-05-10 NOTE — Assessment & Plan Note (Signed)
Jermanie experiences discomfort in her left foot, which she attributes to the use of compression stockings and possibly ill-fitting shoes. She notes a callus formation and slight tenderness, which may be due to changes in foot shape and prolonged periods of standing.  Plan: - Suggest trying shoes with a wider toe box and possibly gel inserts to alleviate pressure and discomfort. - Recommend using a frozen water bottle to ice the foot after work to reduce inflammation. - Consider a referral to a podiatrist if changes in footwear do not lead to improvement.

## 2023-05-10 NOTE — Assessment & Plan Note (Signed)
Amber Nunez reports aching in the chest area, particularly when moving in certain ways, suggesting costochondritis or a muscle strain. This discomfort is localized and does not seem to be exacerbated by cardiovascular issues.  Plan: - Advise Yvaine to monitor the symptoms and return if they worsen or do not improve, to rule out other potential causes and possibly adjust treatment.

## 2023-05-10 NOTE — Assessment & Plan Note (Signed)
Teagin has a history of ablation with no current concerns.   Plan: - Continue to monitor closely for any symptoms and report immediately.

## 2023-05-10 NOTE — Assessment & Plan Note (Addendum)
History of malignant melanoma in 2016 with removal from the left forearm.  Plan: - Referral to dermatology placed today for evaluation and monitoring.

## 2023-05-10 NOTE — Assessment & Plan Note (Signed)
Amber Nunez presents with symptoms suggestive of cervical radiculopathy, characterized by intermittent burning sensations radiating down her left arm, occasional stiffness, and rare episodes of numbness in her thumb and first finger. These symptoms have been ongoing for approximately five to six weeks and are exacerbated by certain movements and positions. The description of her symptoms aligns with nerve impingement, possibly at the C6-C7 level, contributing to her discomfort.  Plan: - Initiate conservative management with neck stretching exercises to alleviate muscle tension and potentially reduce nerve impingement. - Prescribe Cyclobenzaprine to be taken at bedtime to help relax the muscles and ease symptoms. - Recommend watching progressive muscle relaxation videos on YouTube to further aid in muscle relaxation and stress relief. - Follow-up in a few weeks to reassess symptoms and consider further diagnostic imaging (e.g., X-ray of the neck) if no improvement is noted.

## 2023-05-10 NOTE — Assessment & Plan Note (Signed)
History of elevated lipids with difficulty tolerating statin therapy due to myalgias. Currently she is taking pravastatin and ezetimibe without issue.  Plan: - Continue your current medication management.  - Monitor dietary choices to include low saturated fat and carbohydrate options with high fiber.

## 2023-06-01 ENCOUNTER — Ambulatory Visit (INDEPENDENT_AMBULATORY_CARE_PROVIDER_SITE_OTHER): Payer: Medicare HMO

## 2023-06-01 VITALS — Ht 61.0 in | Wt 114.0 lb

## 2023-06-01 DIAGNOSIS — Z0000 Encounter for general adult medical examination without abnormal findings: Secondary | ICD-10-CM | POA: Diagnosis not present

## 2023-06-01 NOTE — Progress Notes (Signed)
Subjective:   Amber Nunez is a 72 y.o. female who presents for Medicare Annual (Subsequent) preventive examination.  Visit Complete: Virtual  I connected with  Amber Nunez on 06/01/23 by a audio enabled telemedicine application and verified that I am speaking with the correct person using two identifiers.  Patient Location: Home  Provider Location: Office/Clinic  I discussed the limitations of evaluation and management by telemedicine. The patient expressed understanding and agreed to proceed.  Patient Medicare AWV questionnaire was completed by the patient on 05/29/2023; I have confirmed that all information answered by patient is correct and no changes since this date.  Review of Systems     Cardiac Risk Factors include: advanced age (>76men, >91 women)     Objective:    Today's Vitals   06/01/23 0926  Weight: 114 lb (51.7 kg)  Height: 5\' 1"  (1.549 m)   Body mass index is 21.54 kg/m.     06/01/2023    9:31 AM 01/28/2021   10:38 AM  Advanced Directives  Does Patient Have a Medical Advance Directive? No No  Would patient like information on creating a medical advance directive?  No - Patient declined    Current Medications (verified) Outpatient Encounter Medications as of 06/01/2023  Medication Sig   alendronate (FOSAMAX) 70 MG tablet Take 1 tablet (70 mg total) by mouth once a week. Take with a full glass of water on an empty stomach.   aspirin 81 MG tablet Take 81 mg by mouth daily.   Biotin 45409 MCG TABS Take 10,000 mcg by mouth daily.    cholecalciferol (VITAMIN D3) 25 MCG (1000 UNIT) tablet Take 1,000 Units by mouth daily.   co-enzyme Q-10 30 MG capsule Take 30 mg by mouth 3 (three) times daily.   cyclobenzaprine (FLEXERIL) 5 MG tablet Take 1 tablet (5 mg total) by mouth 3 (three) times daily as needed for muscle spasms.   ezetimibe (ZETIA) 10 MG tablet Take 1 tablet (10 mg total) by mouth daily.   Multiple Vitamin (MULTIVITAMIN WITH MINERALS)  TABS tablet Take 1 tablet by mouth daily.   multivitamin-lutein (OCUVITE-LUTEIN) CAPS capsule Take 1 capsule by mouth daily.   pravastatin (PRAVACHOL) 20 MG tablet Take 1 tablet (20 mg total) by mouth every evening.   tacrolimus (PROTOPIC) 0.03 % ointment Apply topically as needed.   vitamin C (ASCORBIC ACID) 500 MG tablet Take 500 mg by mouth daily.   vitamin k 100 MCG tablet Take 100 mcg by mouth daily.   clobetasol cream (TEMOVATE) 0.05 % Apply 1 application topically as needed. Apply one application of cream 0.05% to chest daily as needed for rash. (Patient not taking: Reported on 04/24/2023)   No facility-administered encounter medications on file as of 06/01/2023.    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   Carotid artery stenosis 06/19/2019   < 50% bilateral stenosis on dopplers 2018   Essential hypertension, benign    Familial hypercholesterolemia    Hx of dizziness    Hx of syncope    Left wrist fracture    Osteoporosis 03/11/2021   Sinus bradycardia    SVT (supraventricular tachycardia)    Wolff-Parkinson-White (WPW) syndrome    Past Surgical History:  Procedure Laterality Date   ABDOMINAL HYSTERECTOMY  3/4 left overary removed   ABLATION  2000  had 2 for WPW   BASAL CELL CARCINOMA EXCISION     03/2022, removed from face   BREAST BIOPSY Right    negative   BREAST SURGERY     BIOPSY, 3 YEARS AGO   FRACTURE SURGERY  LEFT WRIST   MOLE REMOVAL     OVARIES REMOVED     Family History  Problem Relation Age of Onset   Heart disease Mother        CAD   Arthritis Mother    Heart disease Father    Hyperlipidemia Father    Heart attack Father 23       CABG X 3   Cancer Father    Hyperlipidemia Brother    Hyperlipidemia Brother    Breast  cancer Neg Hx    Social History   Socioeconomic History   Marital status: Married    Spouse name: Not on file   Number of children: Not on file   Years of education: Not on file   Highest education level: Not on file  Occupational History   Not on file  Tobacco Use   Smoking status: Former    Packs/day: 0.50    Years: 5.00    Additional pack years: 0.00    Total pack years: 2.50    Types: Cigarettes    Quit date: 12/12/1988    Years since quitting: 34.4   Smokeless tobacco: Never   Tobacco comments:    quit over 40 yrs ago  Vaping Use   Vaping Use: Never used  Substance and Sexual Activity   Alcohol use: Yes    Comment: occasionly might have 1 beer   Drug use: No   Sexual activity: Not Currently    Partners: Male    Comment: MARRIEDE  Other Topics Concern   Not on file  Social History Narrative   Not on file   Social Determinants of Health   Financial Resource Strain: Low Risk  (01/28/2021)   Overall Financial Resource Strain (CARDIA)    Difficulty of Paying Living Expenses: Not hard at all  Food Insecurity: No Food Insecurity (01/28/2021)   Hunger Vital Sign    Worried About Running Out of Food in the Last Year: Never true    Ran Out of Food in the Last Year: Never true  Transportation Needs: No Transportation Needs (05/29/2023)   PRAPARE - Administrator, Civil Service (Medical): No    Lack of Transportation (Non-Medical): No  Physical Activity: Sufficiently Active (05/29/2023)   Exercise Vital Sign    Days of Exercise per Week: 5 days    Minutes of Exercise per Session: 60 min  Stress: No Stress Concern Present (05/29/2023)   [employer] of Occupational Health - Occupational Stress Questionnaire    Feeling of Stress : Only a little  Social Connections: Unknown (05/29/2023)   Social Connection and Isolation Panel [NHANES]    Frequency of Communication with Friends and Family: Once a week    Frequency of Social Gatherings with Friends and Family:  Three times a week    Attends Religious Services: Not on file    Active Member of Clubs or Organizations: No    Attends Banker Meetings: Never    Marital Status: Married    Tobacco Counseling Counseling given: Not Answered Tobacco comments: quit over 40 yrs ago   Clinical Intake:  Pre-visit preparation completed: Yes  Pain : No/denies pain     Nutritional Status: BMI of 19-24  Normal  Nutritional Risks: None Diabetes: No  How often do you need to have someone help you when you read instructions, pamphlets, or other written materials from your doctor or pharmacy?: 1 - Never  Interpreter Needed?: No  Information entered by :: NAllen LPN   Activities of Daily Living    05/29/2023   10:17 AM  In your present state of health, do you have any difficulty performing the following activities:  Hearing? 0  Vision? 0  Difficulty concentrating or making decisions? 0  Walking or climbing stairs? 0  Dressing or bathing? 0  Doing errands, shopping? 0  Preparing Food and eating ? N  Using the Toilet? N  In the past six months, have you accidently leaked urine? N  Do you have problems with loss of bowel control? N  Managing your Medications? N  Managing your Finances? N  Housekeeping or managing your Housekeeping? N    Patient Care Team: Early, Sung Amabile, NP as PCP - General (Nurse Practitioner) Quintella Reichert, MD as PCP - Cardiology (Cardiology)  Indicate any recent Medical Services you may have received from other than [facility] providers in the past year (date may be approximate).     Assessment:   This is a routine wellness examination for [place].  Hearing/Vision screen Vision Screening - Comments:: Regular eye exams, [facility]  Dietary issues and exercise activities discussed:     Goals Addressed             This Visit's Progress    Patient Stated       06/01/2023, stay healthy       Depression Screen    06/01/2023    9:32 AM 04/24/2023     8:37 AM 01/28/2021   10:40 AM  PHQ 2/9 Scores  PHQ - 2 Score 0 0 0    Fall Risk    05/29/2023   10:17 AM 04/24/2023    8:37 AM 01/28/2021   10:39 AM  Fall Risk   Falls in the past year? 0 0 0  Number falls in past yr: 0 0 0  Injury with Fall? 0 0 0  Risk for fall due to : Medication side effect No Fall Risks No Fall Risks  Follow up Falls prevention discussed;Education provided;Falls evaluation completed Falls evaluation completed Falls evaluation completed;Falls prevention discussed    MEDICARE RISK AT HOME:  Medicare Risk at Home - 06/01/23 0932     Any stairs in or around the home? No    If so, are there any without handrails? No    Home free of loose throw rugs in walkways, pet beds, electrical cords, etc? Yes    Adequate lighting in your home to reduce risk of falls? Yes    Life alert? No    Use of a cane, walker or w/c? No    Grab bars in the bathroom? Yes    Shower chair or bench in shower? No    Elevated toilet seat or a handicapped toilet? Yes             TIMED UP AND GO:  Was the test performed?  No    Cognitive Function:        06/01/2023    9:33 AM  6CIT Screen  What Year? 0 points  What month? 0 points  What time? 0 points  Count back from 20 4 points  Months in reverse 0 points  Repeat phrase 0 points  Total Score 4 points  Immunizations Immunization History  Administered Date(s) Administered   PFIZER Comirnaty(Gray Top)Covid-19 Tri-Sucrose Vaccine 01/02/2021   PFIZER(Purple Top)SARS-COV-2 Vaccination 02/10/2020, 03/07/2020, 03/28/2020    TDAP status: Due, Education has been provided regarding the importance of this vaccine. Advised may receive this vaccine at local pharmacy or Health Dept. Aware to provide a copy of the vaccination record if obtained from local pharmacy or Health Dept. Verbalized acceptance and understanding.  Flu Vaccine status: Declined, Education has been provided regarding the importance of this vaccine but patient  still declined. Advised may receive this vaccine at local pharmacy or Health Dept. Aware to provide a copy of the vaccination record if obtained from local pharmacy or Health Dept. Verbalized acceptance and understanding.  Pneumococcal vaccine status: Up to date  Covid-19 vaccine status: Completed vaccines  Qualifies for Shingles Vaccine? Yes   Zostavax completed Yes   Shingrix Completed?: No.    Education has been provided regarding the importance of this vaccine. Patient has been advised to call insurance company to determine out of pocket expense if they have not yet received this vaccine. Advised may also receive vaccine at local pharmacy or Health Dept. Verbalized acceptance and understanding.  Screening Tests Health Maintenance  Topic Date Due   Hepatitis C Screening  Never done   DTaP/Tdap/Td (1 - Tdap) Never done   Zoster Vaccines- Shingrix (1 of 2) Never done   Pneumonia Vaccine 66+ Years old (1 of 1 - PCV) Never done   MAMMOGRAM  05/01/2021   COVID-19 Vaccine (5 - 2023-24 season) 08/12/2022   INFLUENZA VACCINE  07/13/2023   Medicare Annual Wellness (AWV)  05/31/2024   Colonoscopy  03/01/2025   DEXA SCAN  Completed   HPV VACCINES  Aged Out    Health Maintenance  Health Maintenance Due  Topic Date Due   Hepatitis C Screening  Never done   DTaP/Tdap/Td (1 - Tdap) Never done   Zoster Vaccines- Shingrix (1 of 2) Never done   Pneumonia Vaccine 42+ Years old (1 of 1 - PCV) Never done   MAMMOGRAM  05/01/2021   COVID-19 Vaccine (5 - 2023-24 season) 08/12/2022    Colorectal cancer screening: Type of screening: Colonoscopy. Completed 03/02/2015. Repeat every 10 years  Mammogram status: Completed 03/2023. Repeat every year  Bone Density status: Completed 06/20/2022.   Lung Cancer Screening: (Low Dose CT Chest recommended if Age 69-80 years, 20 pack-year currently smoking OR have quit w/in 15years.) does not qualify.   Lung Cancer Screening Referral: no  Additional  Screening:  Hepatitis C Screening: does qualify;   Vision Screening: Recommended annual ophthalmology exams for early detection of glaucoma and other disorders of the eye. Is the patient up to date with their annual eye exam?  Yes  Who is the provider or what is the name of the office in which the patient attends annual eye exams? [facility] If pt is not established with a provider, would they like to be referred to a provider to establish care? No .   Dental Screening: Recommended annual dental exams for proper oral hygiene  Diabetic Foot Exam: n/a  Community Resource Referral / Chronic Care Management: CRR required this visit?  No   CCM required this visit?  No     Plan:     I have personally reviewed and noted the following in the patient's chart:   Medical and social history Use of alcohol, tobacco or illicit drugs  Current medications and supplements including opioid prescriptions. Patient is not currently  taking opioid prescriptions. Functional ability and status Nutritional status Physical activity Advanced directives List of other physicians Hospitalizations, surgeries, and ER visits in previous 12 months Vitals Screenings to include cognitive, depression, and falls Referrals and appointments  In addition, I have reviewed and discussed with patient certain preventive protocols, quality metrics, and best practice recommendations. A written personalized care plan for preventive services as well as general preventive health recommendations were provided to patient.     Barb Merino, LPN   1/61/0960   After Visit Summary: (MyChart) Due to this being a telephonic visit, the after visit summary with patients personalized plan was offered to patient via MyChart   Nurse Notes: none

## 2023-06-01 NOTE — Patient Instructions (Addendum)
[name] , Thank you for taking time to come for your Medicare Wellness Visit. I appreciate your ongoing commitment to your health goals. Please review the following plan we discussed and let me know if I can assist you in the future.   These are the goals we discussed:  Goals      Patient Stated     I will continue to do my stretches every morning!      Patient Stated     06/01/2023, stay healthy        This is a list of the screening recommended for you and due dates:  Health Maintenance  Topic Date Due   Hepatitis C Screening  Never done   DTaP/Tdap/Td vaccine (1 - Tdap) Never done   Zoster (Shingles) Vaccine (1 of 2) Never done   Pneumonia Vaccine (1 of 1 - PCV) Never done   Mammogram  05/01/2021   COVID-19 Vaccine (5 - 2023-24 season) 08/12/2022   Flu Shot  07/13/2023   Medicare Annual Wellness Visit  05/31/2024   Colon Cancer Screening  03/01/2025   DEXA scan (bone density measurement)  Completed   HPV Vaccine  Aged Out    Advanced directives: Advance directive discussed with you today.   Conditions/risks identified: none  Next appointment: Follow up in one year for your annual wellness visit    Preventive Care 65 Years and Older, Female Preventive care refers to lifestyle choices and visits with your health care provider that can promote health and wellness. What does preventive care include? A yearly physical exam. This is also called an annual well check. Dental exams once or twice a year. Routine eye exams. Ask your health care provider how often you should have your eyes checked. Personal lifestyle choices, including: Daily care of your teeth and gums. Regular physical activity. Eating a healthy diet. Avoiding tobacco and drug use. Limiting alcohol use. Practicing safe sex. Taking low-dose aspirin every day. Taking vitamin and mineral supplements as recommended by your health care provider. What happens during an annual well check? The services  and screenings done by your health care provider during your annual well check will depend on your age, overall health, lifestyle risk factors, and family history of disease. Counseling  Your health care provider may ask you questions about your: Alcohol use. Tobacco use. Drug use. Emotional well-being. Home and relationship well-being. Sexual activity. Eating habits. History of falls. Memory and ability to understand (cognition). Work and work Astronomer. Reproductive health. Screening  You may have the following tests or measurements: Height, weight, and BMI. Blood pressure. Lipid and cholesterol levels. These may be checked every 5 years, or more frequently if you are over 39 years old. Skin check. Lung cancer screening. You may have this screening every year starting at age 76 if you have a 30-pack-year history of smoking and currently smoke or have quit within the past 15 years. Fecal occult blood test (FOBT) of the stool. You may have this test every year starting at age 79. Flexible sigmoidoscopy or colonoscopy. You may have a sigmoidoscopy every 5 years or a colonoscopy every 10 years starting at age 95. Hepatitis C blood test. Hepatitis B blood test. Sexually transmitted disease (STD) testing. Diabetes screening. This is done by checking your blood sugar (glucose) after you have not eaten for a while (fasting). You may have this done every 1-3 years. Bone density scan. This is done to screen for osteoporosis. You may have this done starting at age  65. Mammogram. This may be done every 1-2 years. Talk to your health care provider about how often you should have regular mammograms. Talk with your health care provider about your test results, treatment options, and if necessary, the need for more tests. Vaccines  Your health care provider may recommend certain vaccines, such as: Influenza vaccine. This is recommended every year. Tetanus, diphtheria, and acellular pertussis  (Tdap, Td) vaccine. You may need a Td booster every 10 years. Zoster vaccine. You may need this after age 83. Pneumococcal 13-valent conjugate (PCV13) vaccine. One dose is recommended after age 51. Pneumococcal polysaccharide (PPSV23) vaccine. One dose is recommended after age 45. Talk to your health care provider about which screenings and vaccines you need and how often you need them. This information is not intended to replace advice given to you by your health care provider. Make sure you discuss any questions you have with your health care provider. Document Released: 12/25/2015 Document Revised: 08/17/2016 Document Reviewed: 09/29/2015 Elsevier Interactive Patient Education  2017 [employer] Prevention in the Home Falls can cause injuries. They can happen to people of all ages. There are many things you can do to make your home safe and to help prevent falls. What can I do on the outside of my home? Regularly fix the edges of walkways and driveways and fix any cracks. Remove anything that might make you trip as you walk through a door, such as a raised step or threshold. Trim any bushes or trees on the path to your home. Use bright outdoor lighting. Clear any walking paths of anything that might make someone trip, such as rocks or tools. Regularly check to see if handrails are loose or broken. Make sure that both sides of any steps have handrails. Any raised decks and porches should have guardrails on the edges. Have any leaves, snow, or ice cleared regularly. Use sand or salt on walking paths during winter. Clean up any spills in your garage right away. This includes oil or grease spills. What can I do in the bathroom? Use night lights. Install grab bars by the toilet and in the tub and shower. Do not use towel bars as grab bars. Use non-skid mats or decals in the tub or shower. If you need to sit down in the shower, use a plastic, non-slip stool. Keep the floor dry. Clean  up any water that spills on the floor as soon as it happens. Remove soap buildup in the tub or shower regularly. Attach bath mats securely with double-sided non-slip rug tape. Do not have throw rugs and other things on the floor that can make you trip. What can I do in the bedroom? Use night lights. Make sure that you have a light by your bed that is easy to reach. Do not use any sheets or blankets that are too big for your bed. They should not hang down onto the floor. Have a firm chair that has side arms. You can use this for support while you get dressed. Do not have throw rugs and other things on the floor that can make you trip. What can I do in the kitchen? Clean up any spills right away. Avoid walking on wet floors. Keep items that you use a lot in easy-to-reach places. If you need to reach something above you, use a strong step stool that has a grab bar. Keep electrical cords out of the way. Do not use floor polish or wax that makes floors slippery.  If you must use wax, use non-skid floor wax. Do not have throw rugs and other things on the floor that can make you trip. What can I do with my stairs? Do not leave any items on the stairs. Make sure that there are handrails on both sides of the stairs and use them. Fix handrails that are broken or loose. Make sure that handrails are as long as the stairways. Check any carpeting to make sure that it is firmly attached to the stairs. Fix any carpet that is loose or worn. Avoid having throw rugs at the top or bottom of the stairs. If you do have throw rugs, attach them to the floor with carpet tape. Make sure that you have a light switch at the top of the stairs and the bottom of the stairs. If you do not have them, ask someone to add them for you. What else can I do to help prevent falls? Wear shoes that: Do not have high heels. Have rubber bottoms. Are comfortable and fit you well. Are closed at the toe. Do not wear sandals. If you  use a stepladder: Make sure that it is fully opened. Do not climb a closed stepladder. Make sure that both sides of the stepladder are locked into place. Ask someone to hold it for you, if possible. Clearly mark and make sure that you can see: Any grab bars or handrails. First and last steps. Where the edge of each step is. Use tools that help you move around (mobility aids) if they are needed. These include: Canes. Walkers. Scooters. Crutches. Turn on the lights when you go into a dark area. Replace any light bulbs as soon as they burn out. Set up your furniture so you have a clear path. Avoid moving your furniture around. If any of your floors are uneven, fix them. If there are any pets around you, be aware of where they are. Review your medicines with your doctor. Some medicines can make you feel dizzy. This can increase your chance of falling. Ask your doctor what other things that you can do to help prevent falls. This information is not intended to replace advice given to you by your health care provider. Make sure you discuss any questions you have with your health care provider. Document Released: 09/24/2009 Document Revised: 05/05/2016 Document Reviewed: 01/02/2015 Elsevier Interactive Patient Education  2017 [employer].

## 2023-08-29 NOTE — Telephone Encounter (Signed)
Request for records have been efaxed to [facility].

## 2023-09-06 ENCOUNTER — Encounter: Payer: Self-pay | Admitting: Nurse Practitioner

## 2023-10-15 ENCOUNTER — Other Ambulatory Visit: Payer: Self-pay | Admitting: Cardiology

## 2023-10-16 DIAGNOSIS — L821 Other seborrheic keratosis: Secondary | ICD-10-CM | POA: Diagnosis not present

## 2023-10-16 DIAGNOSIS — Z85828 Personal history of other malignant neoplasm of skin: Secondary | ICD-10-CM | POA: Diagnosis not present

## 2023-10-16 DIAGNOSIS — L570 Actinic keratosis: Secondary | ICD-10-CM | POA: Diagnosis not present

## 2023-10-16 DIAGNOSIS — L814 Other melanin hyperpigmentation: Secondary | ICD-10-CM | POA: Diagnosis not present

## 2023-10-16 DIAGNOSIS — D225 Melanocytic nevi of trunk: Secondary | ICD-10-CM | POA: Diagnosis not present

## 2023-10-16 DIAGNOSIS — Z7189 Other specified counseling: Secondary | ICD-10-CM | POA: Diagnosis not present

## 2023-10-16 DIAGNOSIS — Z08 Encounter for follow-up examination after completed treatment for malignant neoplasm: Secondary | ICD-10-CM | POA: Diagnosis not present

## 2023-10-16 DIAGNOSIS — X32XXXA Exposure to sunlight, initial encounter: Secondary | ICD-10-CM | POA: Diagnosis not present

## 2023-11-18 ENCOUNTER — Encounter: Payer: Self-pay | Admitting: Cardiology

## 2024-01-07 ENCOUNTER — Other Ambulatory Visit: Payer: Self-pay | Admitting: Cardiology

## 2024-01-18 ENCOUNTER — Ambulatory Visit: Payer: Medicare HMO | Attending: Cardiology | Admitting: Cardiology

## 2024-01-18 ENCOUNTER — Other Ambulatory Visit: Payer: Self-pay | Admitting: *Deleted

## 2024-01-18 ENCOUNTER — Encounter: Payer: Self-pay | Admitting: Cardiology

## 2024-01-18 ENCOUNTER — Telehealth: Payer: Self-pay | Admitting: *Deleted

## 2024-01-18 VITALS — BP 120/60 | HR 66 | Ht 61.0 in | Wt 113.0 lb

## 2024-01-18 DIAGNOSIS — I951 Orthostatic hypotension: Secondary | ICD-10-CM | POA: Diagnosis not present

## 2024-01-18 DIAGNOSIS — I6523 Occlusion and stenosis of bilateral carotid arteries: Secondary | ICD-10-CM

## 2024-01-18 DIAGNOSIS — Z79899 Other long term (current) drug therapy: Secondary | ICD-10-CM

## 2024-01-18 DIAGNOSIS — G4719 Other hypersomnia: Secondary | ICD-10-CM

## 2024-01-18 DIAGNOSIS — E7800 Pure hypercholesterolemia, unspecified: Secondary | ICD-10-CM

## 2024-01-18 DIAGNOSIS — I456 Pre-excitation syndrome: Secondary | ICD-10-CM | POA: Diagnosis not present

## 2024-01-18 NOTE — Progress Notes (Signed)
 Cardiology Office Note:    Date:  01/18/2024   ID:  Amber Nunez, DOB 07/15/1951, MRN [id number]  PCP:  Oris Camie BRAVO, NP  Cardiologist:  Wilbert Bihari, MD    Referring MD: Oris Camie BRAVO, NP   Chief Complaint  Patient presents with   Follow-up    SVT/WPW, hypertension, hyperlipidemia, carotid artery stenosis    History of Present Illness:    Amber Nunez is a 73 y.o. 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   Bilateral carotid dopplers in 2020 showed 1-39% bilateral carotid stenosis.  She is here today for followup and is doing well.  She has been having problems with snoring and has been feeling sleepy during the day and has to take naps during the day.  She sleeps 6-7 hours nightly. She has started snoring after taking away 1 pillow due to some neck issues. She denies any chest pain or pressure, PND, orthopnea, LE edema, dizziness (wears compression hose at work), palpitations or syncope. She has no SOB with ADLs but does get SOB at work when lifting heavy things and running around. She has had some tingling and burning in her inner elbow and upper arm and neck and was worried it was the statin but has been given neck exercises to do that have significantly helped. She is compliant with her meds and is tolerating meds with no SE.    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   Carotid artery stenosis 06/19/2019   < 50% bilateral stenosis on dopplers 2018   Essential hypertension, benign    Familial hypercholesterolemia    Hx of dizziness    Hx of syncope    Left wrist fracture    Orthostatic hypotension    Osteoporosis 03/11/2021   Sinus bradycardia    SVT (supraventricular tachycardia) (HCC)    Wolff-Parkinson-White (WPW) syndrome     Past Surgical History:   Procedure Laterality Date   ABDOMINAL HYSTERECTOMY  3/4 left overary removed   ABLATION  2000   had 2 for WPW   BASAL CELL CARCINOMA EXCISION     03/2022, removed from face   BREAST BIOPSY Right    negative   BREAST SURGERY     BIOPSY, 3 YEARS AGO   FRACTURE SURGERY  LEFT WRIST   MOLE REMOVAL     OVARIES REMOVED      Current Medications: Current Meds  Medication Sig   alendronate  (FOSAMAX ) 70 MG tablet Take 1 tablet (70 mg total) by mouth once a week. Take with a full glass of water on an empty stomach.   aspirin 81 MG tablet Take 81 mg by mouth daily.   Biotin 89999 MCG TABS Take 10,000 mcg by mouth daily.    cholecalciferol (VITAMIN D3) 25 MCG (1000 UNIT) tablet Take 1,000 Units by mouth daily.   cyclobenzaprine  (FLEXERIL ) 5 MG tablet Take 1 tablet (5 mg total) by mouth 3 (three) times daily as needed for muscle spasms.   ezetimibe  (ZETIA ) 10 MG tablet Take 1 tablet (10 mg total) by mouth daily.   Multiple Vitamin (MULTIVITAMIN WITH MINERALS) TABS tablet Take 1 tablet by mouth daily.   multivitamin-lutein (OCUVITE-LUTEIN) CAPS capsule Take 1 capsule by mouth daily.   pravastatin  (PRAVACHOL ) 20 MG tablet Take  1 tablet (20 mg total) by mouth every evening.   tacrolimus (PROTOPIC) 0.03 % ointment Apply topically as needed.   vitamin C (ASCORBIC ACID) 500 MG tablet Take 500 mg by mouth daily.     Allergies:   Penicillins, Atorvastatin , Erythromycin, Hydrocodone-acetaminophen, Rosuvastatin , and Zebeta  [bisoprolol  fumarate]   Social History   Socioeconomic History   Marital status: Married    Spouse name: Not on file   Number of children: Not on file   Years of education: Not on file   Highest education level: Not on file  Occupational History   Not on file  Tobacco Use   Smoking status: Former    Current packs/day: 0.00    Average packs/day: 0.5 packs/day for 5.0 years (2.5 ttl pk-yrs)    Types: Cigarettes    Start date: 12/13/1983    Quit date: 12/12/1988    Years since  quitting: 35.1   Smokeless tobacco: Never   Tobacco comments:    quit over 40 yrs ago  Vaping Use   Vaping status: Never Used  Substance and Sexual Activity   Alcohol use: Yes    Comment: occasionly might have 1 beer   Drug use: No   Sexual activity: Not Currently    Partners: Male    Comment: MARRIEDE  Other Topics Concern   Not on file  Social History Narrative   Not on file   Social Drivers of Health   Financial Resource Strain: Low Risk  (01/28/2021)   Overall Financial Resource Strain (CARDIA)    Difficulty of Paying Living Expenses: Not hard at all  Food Insecurity: No Food Insecurity (01/28/2021)   Hunger Vital Sign    Worried About Running Out of Food in the Last Year: Never true    Ran Out of Food in the Last Year: Never true  Transportation Needs: No Transportation Needs (05/29/2023)   PRAPARE - Administrator, Civil Service (Medical): No    Lack of Transportation (Non-Medical): No  Physical Activity: Sufficiently Active (05/29/2023)   Exercise Vital Sign    Days of Exercise per Week: 5 days    Minutes of Exercise per Session: 60 min  Stress: No Stress Concern Present (05/29/2023)   [employer] of Occupational Health - Occupational Stress Questionnaire    Feeling of Stress : Only a little  Social Connections: Unknown (05/29/2023)   Social Connection and Isolation Panel [NHANES]    Frequency of Communication with Friends and Family: Once a week    Frequency of Social Gatherings with Friends and Family: Three times a week    Attends Religious Services: Not on file    Active Member of Clubs or Organizations: No    Attends Banker Meetings: Never    Marital Status: Married     Family History: The patient's family history includes Arthritis in her mother; Cancer in her father; Heart attack (age of onset: 8) in her father; Heart disease in her father and mother; Hyperlipidemia in her brother, brother, and father. There is no history of  Breast cancer.  ROS:   Please see the history of present illness.    ROS  All other systems reviewed and negative.   EKGs/Labs/Other Studies Reviewed:    The following studies were reviewed today: PAP compliance download EKG Interpretation Date/Time:  Thursday January 18 2024 09:33:28 EST Ventricular Rate:  66 PR Interval:  180 QRS Duration:  76 QT Interval:  386 QTC Calculation: 404 R Axis:   27  Text Interpretation: Normal sinus rhythm Normal ECG No previous ECGs available Confirmed by Shlomo Corning [phone]) on 01/18/2024 9:38:31 AM    Recent Labs: 04/24/2023: ALT 15; BUN 25; Creatinine, Ser 0.73; Hemoglobin 14.1; Platelets 180; Potassium 4.3; Sodium 143   Recent Lipid Panel    Component Value Date/Time   CHOL 186 04/24/2023 1011   TRIG 67 04/24/2023 1011   HDL 91 04/24/2023 1011   CHOLHDL 2.0 04/24/2023 1011   CHOLHDL 3 01/09/2018 0757   VLDL 16.8 01/09/2018 0757   LDLCALC 83 04/24/2023 1011    Physical Exam:    VS:  BP 120/60   Pulse 66   Ht 5' 1 (1.549 m)   Wt 113 lb (51.3 kg)   SpO2 98%   BMI 21.35 kg/m     Wt Readings from Last 3 Encounters:  01/18/24 113 lb (51.3 kg)  06/01/23 114 lb (51.7 kg)  04/24/23 114 lb 3.2 oz (51.8 kg)    GEN: Well nourished, well developed in no acute distress HEENT: Normal NECK: No JVD; No carotid bruits LYMPHATICS: No lymphadenopathy CARDIAC:RRR, no murmurs, rubs, gallops RESPIRATORY:  Clear to auscultation without rales, wheezing or rhonchi  ABDOMEN: Soft, non-tender, non-distended MUSCULOSKELETAL:  No edema; No deformity  SKIN: Warm and dry NEUROLOGIC:  Alert and oriented x 3 PSYCHIATRIC:  Normal affect  ASSESSMENT:    1. WPW (Wolff-Parkinson-White syndrome)   2. Bilateral carotid artery stenosis   3. Pure hypercholesterolemia   4. Orthostatic hypotension   5. Excessive daytime sleepiness      PLAN:    In order of problems listed above:  1.  SVT with WPW -S/P WPW ablation -She is maintaining normal  sinus rhythm on exam today -She has not had any palpitations since I saw her last  2.  Bilateral carotid artery stenosis -dopplers 01/2022 with minimal thin plaque bilaterally with degree of stenosis less than 50% -Repeat Doppler since it has been 2 years -continue ASA and statin  3.  HLD -LDL goal < 70 -she was having some problems with tingling and burning in her left arm and neck and has been doing neck exercises which have significantly improved>>I think this is related to neuropathy and not her statin -I have personally reviewed and interpreted outside labs performed by patient's PCP which showed LDL 83 and HDL 91 on 04/24/2023 and ALT 15 on 04/24/2023 -Repeat FLP and ALT -Continue prescription drug management with Zetia  10 mg daily and pravastatin  20 mg daily with as needed refills  4.  Orthostatic hypotension -She is had problems with dizziness that mainly occurs after standing at work for long periods of time -Continue using compression hose during working hours  5.  Excessive daytime sleepiness -she has been sleepy recently during the day and has to nap -will get a HST to assess for OSA    Medication Adjustments/Labs and Tests Ordered: Current medicines are reviewed at length with the patient today.  Concerns regarding medicines are outlined above.  Orders Placed This Encounter  Procedures   EKG 12-Lead   No orders of the defined types were placed in this encounter.   Signed, Corning Shlomo, MD  01/18/2024 9:49 AM     [facility]

## 2024-01-18 NOTE — Patient Instructions (Addendum)
 Medication Instructions:  The current medical regimen is effective;  continue present plan and medications.  *If you need a refill on your cardiac medications before your next appointment, please call your pharmacy*   Lab Work: Please have blood work at your closest [facility]. (Lipid/ALT)  If you have labs (blood work) drawn today and your tests are completely normal, you will receive your results only by: MyChart Message (if you have MyChart) OR A paper copy in the mail If you have any lab test that is abnormal or we need to change your treatment, we will call you to review the results.   Testing/Procedures: Your physician has requested that you have a carotid duplex. This test is an ultrasound of the carotid arteries in your neck. It looks at blood flow through these arteries that supply the brain with blood. Allow one hour for this exam. There are no restrictions or special instructions.  You have been ordered to have a home sleep study.   Follow-Up: At [facility], you and your health needs are our priority.  As part of our continuing mission to provide you with exceptional heart care, we have created designated Provider Care Teams.  These Care Teams include your primary Cardiologist (physician) and Advanced Practice Providers (APPs -  Physician Assistants and Nurse Practitioners) who all work together to provide you with the care you need, when you need it.  We recommend signing up for the patient portal called MyChart.  Sign up information is provided on this After Visit Summary.  MyChart is used to connect with patients for Virtual Visits (Telemedicine).  Patients are able to view lab/test results, encounter notes, upcoming appointments, etc.  Non-urgent messages can be sent to your provider as well.   To learn more about what you can do with MyChart, go to [email or web address].    Your next appointment:   1 year(s)  Provider:   Wilbert Bihari, MD

## 2024-01-18 NOTE — Telephone Encounter (Signed)
 DR. SHLOMO QUIVER ITAMAR STUDY.   Patient agreement reviewed and signed on 01/18/2024.  WatchPAT issued to patient on 01/18/2024 by Niels Jest, CMA. Patient aware to not open the WatchPAT box until contacted with the activation PIN. Patient profile initialized in CloudPAT on 01/18/2024 by NIELS JEST, CMA. Device serial number: [id number]  Please list Reason for Call as Advice Only and type WatchPAT issued to patient in the comment box.   PT WILL DOWNLOAD APP WHEN SHE GETS HOME.

## 2024-01-29 DIAGNOSIS — E7800 Pure hypercholesterolemia, unspecified: Secondary | ICD-10-CM | POA: Diagnosis not present

## 2024-01-29 DIAGNOSIS — Z79899 Other long term (current) drug therapy: Secondary | ICD-10-CM | POA: Diagnosis not present

## 2024-01-30 LAB — LIPID PANEL
Chol/HDL Ratio: 2 {ratio} (ref 0.0–4.4)
Cholesterol, Total: 180 mg/dL (ref 100–199)
HDL: 90 mg/dL (ref >39)
LDL Chol Calc (NIH): 78 mg/dL (ref 0–99)
Triglycerides: 62 mg/dL (ref 0–149)
VLDL Cholesterol Cal: 12 mg/dL (ref 5–40)

## 2024-01-30 LAB — ALT: ALT: 15 [IU]/L (ref 0–32)

## 2024-01-31 DIAGNOSIS — E785 Hyperlipidemia, unspecified: Secondary | ICD-10-CM

## 2024-02-01 ENCOUNTER — Other Ambulatory Visit: Payer: Self-pay

## 2024-02-01 DIAGNOSIS — E7800 Pure hypercholesterolemia, unspecified: Secondary | ICD-10-CM

## 2024-02-01 DIAGNOSIS — E785 Hyperlipidemia, unspecified: Secondary | ICD-10-CM

## 2024-02-01 MED ORDER — PRAVASTATIN SODIUM 40 MG PO TABS: 40.0000 mg | ORAL_TABLET | Freq: Every evening | ORAL | 3 refills | Status: DC

## 2024-02-12 ENCOUNTER — Ambulatory Visit (HOSPITAL_BASED_OUTPATIENT_CLINIC_OR_DEPARTMENT_OTHER): Payer: Medicare HMO

## 2024-02-12 DIAGNOSIS — I6523 Occlusion and stenosis of bilateral carotid arteries: Secondary | ICD-10-CM | POA: Diagnosis not present

## 2024-02-12 NOTE — Telephone Encounter (Signed)
 Called and spoke w/ pt.  Explained why it can take longer for mail due to our process. Aware she should NOT need paperwork to get the bloodwork and have them call our office if needed.   I also printed the lab orders again and left at front desk in case she needs. Pt appreciates the call, information and help with this.

## 2024-02-14 ENCOUNTER — Encounter: Payer: Self-pay | Admitting: Cardiology

## 2024-02-19 DIAGNOSIS — H353131 Nonexudative age-related macular degeneration, bilateral, early dry stage: Secondary | ICD-10-CM | POA: Diagnosis not present

## 2024-02-22 DIAGNOSIS — Z0100 Encounter for examination of eyes and vision without abnormal findings: Secondary | ICD-10-CM | POA: Diagnosis not present

## 2024-02-26 ENCOUNTER — Encounter: Payer: Self-pay | Admitting: Pharmacist

## 2024-03-01 ENCOUNTER — Encounter: Payer: Self-pay | Admitting: Nurse Practitioner

## 2024-03-01 DIAGNOSIS — G4709 Other insomnia: Secondary | ICD-10-CM

## 2024-03-01 DIAGNOSIS — F4321 Adjustment disorder with depressed mood: Secondary | ICD-10-CM

## 2024-03-06 MED ORDER — ALPRAZOLAM 0.5 MG PO TABS
0.2500 mg | ORAL_TABLET | Freq: Two times a day (BID) | ORAL | 0 refills | Status: AC | PRN
Start: 2024-03-06 — End: ?

## 2024-03-14 DIAGNOSIS — E7800 Pure hypercholesterolemia, unspecified: Secondary | ICD-10-CM | POA: Diagnosis not present

## 2024-03-14 DIAGNOSIS — E785 Hyperlipidemia, unspecified: Secondary | ICD-10-CM | POA: Diagnosis not present

## 2024-03-15 LAB — LIPID PANEL
Chol/HDL Ratio: 2 ratio (ref 0.0–4.4)
Cholesterol, Total: 193 mg/dL (ref 100–199)
HDL: 95 mg/dL (ref >39)
LDL Chol Calc (NIH): 85 mg/dL (ref 0–99)
Triglycerides: 69 mg/dL (ref 0–149)
VLDL Cholesterol Cal: 13 mg/dL (ref 5–40)

## 2024-03-15 LAB — ALT: ALT: 17 IU/L (ref 0–32)

## 2024-03-18 ENCOUNTER — Telehealth: Payer: Self-pay

## 2024-03-18 NOTE — Telephone Encounter (Signed)
 Spoke with pt regarding her results. Pt stated her husband passed away about two weeks ago and she believes that due to her not eating well during this time that her labs are off. Pt reported that she has been working with our Pharmacist Melissa and they changed her Pravastatin to 20 mg as she could not tolerate 40 mg. Pt was told this information would be forwarded to Dr. Mayford Knife. Pt verbalized understanding. All questions, if any, were answered.

## 2024-03-18 NOTE — Telephone Encounter (Signed)
-----   Message from Armanda Magic sent at 03/17/2024  8:24 AM EDT ----- Lipids still not at goal.  Please forward to lipid clinic for further recommendations.

## 2024-03-20 NOTE — Telephone Encounter (Signed)
 Spoke to pt regarding a referral to Pharm D with Melissa. Pt stated she is not interested in making an appointment as she has been speaking to [place] via [employer]. The pt also stated she is moving to another [place] in [place] and stated she does not have time or energy for another appointment. Pt is aware that lipids are not at goal. Pt verbalized understanding. All questions, if any, were answered.

## 2024-03-28 ENCOUNTER — Other Ambulatory Visit: Payer: Self-pay | Admitting: Cardiology

## 2024-03-28 DIAGNOSIS — Z1231 Encounter for screening mammogram for malignant neoplasm of breast: Secondary | ICD-10-CM | POA: Diagnosis not present

## 2024-04-02 ENCOUNTER — Telehealth: Payer: Self-pay

## 2024-04-02 NOTE — Telephone Encounter (Signed)
**Note De-Identified [place] Putman Obfuscation** Ordering provider: Dr Micael Adas Associated diagnoses: Excessive daytime sleepiness-G47.19 and R06.83  WatchPAT PA obtained on 04/02/2024 by Ladarrell Cornwall, Isabella Mao, LPN. Authorization: Per the [employer] provider portal: Prior authorization is not required for this code.: 95800 (Itamar-HST).  Patient notified of PIN (1234) on 04/02/2024 [place] Notification Method: phone-The pt states that a lot has happened in her life lately due to the death of her husband. She states that she is not going to do the sleep test because she dosent feel that she needs it as she is not having any issues and because she is moving to [place] in the near future. She requested to return the Itamar-HST device to our [facility] office as that is the closest [facility] office to her (She assured me that she has not opened the WatchPAT One-HST).  After s/w the staff at our [facility] office I advised the pt that she can drop the device off with the staff in the front office at our [facility] office. She verbalized understanding and thanked me for my call.

## 2024-04-04 ENCOUNTER — Ambulatory Visit: Payer: Medicare HMO | Admitting: Pharmacist

## 2024-04-05 ENCOUNTER — Telehealth: Payer: Self-pay

## 2024-04-05 NOTE — Telephone Encounter (Signed)
 Lab req for patient will be discarded since patient hasn't picked them up from the front office.

## 2024-04-18 NOTE — Telephone Encounter (Signed)
 Itamar device returned

## 2024-05-24 ENCOUNTER — Encounter (HOSPITAL_BASED_OUTPATIENT_CLINIC_OR_DEPARTMENT_OTHER): Payer: Self-pay | Admitting: Emergency Medicine

## 2024-05-24 ENCOUNTER — Other Ambulatory Visit (HOSPITAL_BASED_OUTPATIENT_CLINIC_OR_DEPARTMENT_OTHER): Payer: Self-pay

## 2024-05-24 ENCOUNTER — Emergency Department (HOSPITAL_BASED_OUTPATIENT_CLINIC_OR_DEPARTMENT_OTHER)

## 2024-05-24 ENCOUNTER — Emergency Department (HOSPITAL_BASED_OUTPATIENT_CLINIC_OR_DEPARTMENT_OTHER)
Admission: EM | Admit: 2024-05-24 | Discharge: 2024-05-24 | Disposition: A | Discharge status: Home / Self Care | Attending: Emergency Medicine | Admitting: Emergency Medicine

## 2024-05-24 ENCOUNTER — Other Ambulatory Visit: Payer: Self-pay

## 2024-05-24 DIAGNOSIS — Z79899 Other long term (current) drug therapy: Secondary | ICD-10-CM | POA: Diagnosis not present

## 2024-05-24 DIAGNOSIS — Z7982 Long term (current) use of aspirin: Secondary | ICD-10-CM | POA: Insufficient documentation

## 2024-05-24 DIAGNOSIS — J984 Other disorders of lung: Secondary | ICD-10-CM | POA: Diagnosis not present

## 2024-05-24 DIAGNOSIS — I10 Essential (primary) hypertension: Secondary | ICD-10-CM | POA: Insufficient documentation

## 2024-05-24 DIAGNOSIS — R4182 Altered mental status, unspecified: Secondary | ICD-10-CM | POA: Diagnosis not present

## 2024-05-24 DIAGNOSIS — J189 Pneumonia, unspecified organism: Secondary | ICD-10-CM | POA: Diagnosis not present

## 2024-05-24 DIAGNOSIS — R918 Other nonspecific abnormal finding of lung field: Secondary | ICD-10-CM | POA: Diagnosis not present

## 2024-05-24 DIAGNOSIS — R911 Solitary pulmonary nodule: Secondary | ICD-10-CM | POA: Diagnosis not present

## 2024-05-24 DIAGNOSIS — R251 Tremor, unspecified: Secondary | ICD-10-CM | POA: Diagnosis present

## 2024-05-24 DIAGNOSIS — R0602 Shortness of breath: Secondary | ICD-10-CM | POA: Diagnosis not present

## 2024-05-24 DIAGNOSIS — I700 Atherosclerosis of aorta: Secondary | ICD-10-CM | POA: Diagnosis not present

## 2024-05-24 LAB — COMPREHENSIVE METABOLIC PANEL WITH GFR
ALT: 16 U/L (ref 0–44)
AST: 30 U/L (ref 15–41)
Albumin: 4.5 g/dL (ref 3.5–5.0)
Alkaline Phosphatase: 45 U/L (ref 38–126)
Anion gap: 13 (ref 5–15)
BUN: 16 mg/dL (ref 8–23)
CO2: 26 mmol/L (ref 22–32)
Calcium: 9.7 mg/dL (ref 8.9–10.3)
Chloride: 100 mmol/L (ref 98–111)
Creatinine, Ser: 0.71 mg/dL (ref 0.44–1.00)
GFR, Estimated: >60 mL/min (ref >60)
Glucose, Bld: 116 mg/dL — ABNORMAL HIGH (ref 70–99)
Potassium: 4 mmol/L (ref 3.5–5.1)
Sodium: 140 mmol/L (ref 135–145)
Total Bilirubin: 0.6 mg/dL (ref 0.0–1.2)
Total Protein: 7.6 g/dL (ref 6.5–8.1)

## 2024-05-24 LAB — TROPONIN T, HIGH SENSITIVITY: Troponin T High Sensitivity: <15 ng/L (ref <19)

## 2024-05-24 LAB — TSH: TSH: 1.24 u[IU]/mL (ref 0.350–4.500)

## 2024-05-24 LAB — CBC WITH DIFFERENTIAL/PLATELET
Abs Immature Granulocytes: 0.01 10*3/uL (ref 0.00–0.07)
Basophils Absolute: 0 10*3/uL (ref 0.0–0.1)
Basophils Relative: 1 %
Eosinophils Absolute: 0.1 10*3/uL (ref 0.0–0.5)
Eosinophils Relative: 1 %
HCT: 39.5 % (ref 36.0–46.0)
Hemoglobin: 13.1 g/dL (ref 12.0–15.0)
Immature Granulocytes: 0 %
Lymphocytes Relative: 17 %
Lymphs Abs: 1 10*3/uL (ref 0.7–4.0)
MCH: 30.2 pg (ref 26.0–34.0)
MCHC: 33.2 g/dL (ref 30.0–36.0)
MCV: 91 fL (ref 80.0–100.0)
Monocytes Absolute: 0.3 10*3/uL (ref 0.1–1.0)
Monocytes Relative: 5 %
Neutro Abs: 4.5 10*3/uL (ref 1.7–7.7)
Neutrophils Relative %: 76 %
Platelets: 222 10*3/uL (ref 150–400)
RBC: 4.34 MIL/uL (ref 3.87–5.11)
RDW: 13.4 % (ref 11.5–15.5)
WBC: 5.9 10*3/uL (ref 4.0–10.5)
nRBC: 0 % (ref 0.0–0.2)

## 2024-05-24 LAB — URINALYSIS, ROUTINE W REFLEX MICROSCOPIC
Bilirubin Urine: NEGATIVE
Glucose, UA: NEGATIVE mg/dL
Hgb urine dipstick: NEGATIVE
Ketones, ur: NEGATIVE mg/dL
Leukocytes,Ua: NEGATIVE
Nitrite: NEGATIVE
Protein, ur: NEGATIVE mg/dL
Specific Gravity, Urine: 1.008 (ref 1.005–1.030)
pH: 6 (ref 5.0–8.0)

## 2024-05-24 LAB — CBG MONITORING, ED: Glucose-Capillary: 113 mg/dL — ABNORMAL HIGH (ref 70–99)

## 2024-05-24 LAB — LACTIC ACID, PLASMA: Lactic Acid, Venous: 1.1 mmol/L (ref 0.5–1.9)

## 2024-05-24 MED ORDER — SODIUM CHLORIDE 0.9 % IV BOLUS: 500.0000 mL | Freq: Once | INTRAVENOUS | Status: AC

## 2024-05-24 MED ORDER — SODIUM CHLORIDE 0.9 % IV SOLN
1.0000 g | Freq: Once | INTRAVENOUS | Status: AC
  Filled 2024-05-24: qty 10

## 2024-05-24 MED ORDER — IOHEXOL 350 MG/ML SOLN: 75.0000 mL | Freq: Once | INTRAVENOUS | Status: AC | PRN

## 2024-05-24 MED ORDER — DOXYCYCLINE HYCLATE 100 MG PO CAPS: 100.0000 mg | ORAL_CAPSULE | Freq: Two times a day (BID) | ORAL | 0 refills | Status: DC

## 2024-05-24 MED ORDER — DOXYCYCLINE HYCLATE 100 MG PO CAPS
100.0000 mg | ORAL_CAPSULE | Freq: Two times a day (BID) | ORAL | 0 refills | Status: DC
  Filled 2024-05-24 (×2): qty 14, 7d supply, fill #0

## 2024-05-24 MED ORDER — DOXYCYCLINE HYCLATE 100 MG PO TABS
100.0000 mg | ORAL_TABLET | Freq: Once | ORAL | Status: AC
  Administered 2024-05-24: 100 mg via ORAL
  Filled 2024-05-24: qty 1

## 2024-05-24 NOTE — ED Provider Notes (Signed)
 [facility]une[facility] EMERGENCY DEPARTMENT AT [facility] Provider Note   CSN: [id number] Arrival date & time: 05/24/24  4098     Patient presents with: Tremors   Amber Nunez is a 73 y.o. female.   Pt is a 73 yo female with pmhx significant for WPW, SVT, HTN, HLD, anxiety and osteoporosis.  Pt said her husband died recently in their apartment and she's been packing all his stuff away because she is planning to move back to [place].  She said he starting getting sick at 0330 and she's been waking up a lot at 0330 and has not been sleeping well.  Today, she woke up around then and felt like she was vibrating.  She described it like how you feel when your car is idling at a stoplight and your hands are on the steering wheel.  She then felt like she had tremors.  She said it felt like she had a lot of caffeine, but had not had any.  Pt did take 81 mg asa.  No pain now and tremors have improved.  She did a home EKG around 0600 and she was in NSR.       Prior to Admission medications   Medication Sig Start Date End Date Taking? Authorizing Provider  alendronate  (FOSAMAX ) 70 MG tablet Take 1 tablet (70 mg total) by mouth once a week. Take with a full glass of water on an empty stomach. 04/24/23   Early, Sara E, NP  ALPRAZolam  (XANAX ) 0.5 MG tablet Take 0.5-1 tablets (0.25-0.5 mg total) by mouth 2 (two) times daily as needed for anxiety or sleep. 03/06/24   Early, Sara E, NP  aspirin 81 MG tablet Take 81 mg by mouth daily.    [provider]  Biotin 11914 MCG TABS Take 10,000 mcg by mouth daily.  03/13/19   [provider]  cholecalciferol (VITAMIN D3) 25 MCG (1000 UNIT) tablet Take 1,000 Units by mouth daily.    [provider]  cyclobenzaprine  (FLEXERIL ) 5 MG tablet Take 1 tablet (5 mg total) by mouth 3 (three) times daily as needed for muscle spasms. 04/24/23   Early, Sara E, NP  doxycycline (VIBRAMYCIN) 100 MG capsule Take 1 capsule (100 mg total) by mouth 2 (two) times  daily. 05/24/24   Sueellen Emery, MD  ezetimibe  (ZETIA ) 10 MG tablet Take 1 tablet by mouth daily 03/28/24   Jacqueline Matsu, MD  Multiple Vitamin (MULTIVITAMIN WITH MINERALS) TABS tablet Take 1 tablet by mouth daily.    [provider]  multivitamin-lutein (OCUVITE-LUTEIN) CAPS capsule Take 1 capsule by mouth daily.    [provider]  pravastatin  (PRAVACHOL ) 40 MG tablet Take 1 tablet (40 mg total) by mouth every evening. 02/01/24   Jacqueline Matsu, MD  tacrolimus (PROTOPIC) 0.03 % ointment Apply topically as needed.    [provider]  vitamin C (ASCORBIC ACID) 500 MG tablet Take 500 mg by mouth daily.    [provider]    Allergies: Penicillins, Atorvastatin , Erythromycin, Hydrocodone-acetaminophen, Rosuvastatin , and Zebeta  [bisoprolol  fumarate]    Review of Systems  Neurological:  Positive for tremors.  All other systems reviewed and are negative.   Updated Vital Signs BP (!) 158/79   Pulse 75   Temp 98.9 F (37.2 C)   Resp 16   Ht 5' 1 (1.549 m)   Wt 48.5 kg   SpO2 100%   BMI 20.22 kg/m   Physical Exam Vitals and nursing note reviewed.  Constitutional:  Appearance: Normal appearance.  HENT:     Head: Normocephalic and atraumatic.     Right Ear: External ear normal.     Left Ear: External ear normal.     Nose: Nose normal.     Mouth/Throat:     Mouth: Mucous membranes are dry.   Eyes:     Extraocular Movements: Extraocular movements intact.     Conjunctiva/sclera: Conjunctivae normal.     Pupils: Pupils are equal, round, and reactive to light.    Cardiovascular:     Rate and Rhythm: Normal rate and regular rhythm.     Pulses: Normal pulses.     Heart sounds: Normal heart sounds.  Pulmonary:     Effort: Pulmonary effort is normal.     Breath sounds: Normal breath sounds.  Abdominal:     General: Abdomen is flat.     Palpations: Abdomen is soft.   Musculoskeletal:        General: Normal range of motion.     Cervical  back: Normal range of motion and neck supple.   Skin:    General: Skin is warm.     Capillary Refill: Capillary refill takes less than 2 seconds.   Neurological:     General: No focal deficit present.     Mental Status: She is alert and oriented to person, place, and time.   Psychiatric:        Mood and Affect: Mood normal.        Behavior: Behavior normal.     (all labs ordered are listed, but only abnormal results are displayed) Labs Reviewed  COMPREHENSIVE METABOLIC PANEL WITH GFR - Abnormal; Notable for the following components:      Result Value   Glucose, Bld 116 (*)    All other components within normal limits  URINALYSIS, ROUTINE W REFLEX MICROSCOPIC - Abnormal; Notable for the following components:   Color, Urine COLORLESS (*)    All other components within normal limits  CBG MONITORING, ED - Abnormal; Notable for the following components:   Glucose-Capillary 113 (*)    All other components within normal limits  CULTURE, BLOOD (ROUTINE X 2)  CULTURE, BLOOD (ROUTINE X 2)  CBC WITH DIFFERENTIAL/PLATELET  TSH  LACTIC ACID, PLASMA  LACTIC ACID, PLASMA  TROPONIN T, HIGH SENSITIVITY  TROPONIN T, HIGH SENSITIVITY    EKG: EKG Interpretation Date/Time:  Friday May 24 2024 07:36:51 EDT Ventricular Rate:  77 PR Interval:  152 QRS Duration:  119 QT Interval:  404 QTC Calculation: 458 R Axis:   26  Text Interpretation: Sinus rhythm Nonspecific intraventricular conduction delay Low voltage, precordial leads No significant change since last tracing Confirmed by Sueellen Emery [phone]) on 05/24/2024 7:48:15 AM  Radiology: CT Angio Chest PE W and/or Wo Contrast Result Date: 05/24/2024 CLINICAL DATA:  Tremors, stress. Lung airspace opacities on chest radiography. EXAM: CT ANGIOGRAPHY CHEST WITH CONTRAST TECHNIQUE: Multidetector CT imaging of the chest was performed using the standard protocol during bolus administration of intravenous contrast. Multiplanar CT image  reconstructions and MIPs were obtained to evaluate the vascular anatomy. RADIATION DOSE REDUCTION: This exam was performed according to the departmental dose-optimization program which includes automated exposure control, adjustment of the mA and/or kV according to patient size and/or use of iterative reconstruction technique. CONTRAST:  75mL OMNIPAQUE IOHEXOL 350 MG/ML SOLN COMPARISON:  Chest radiograph 05/24/2024 FINDINGS: Cardiovascular: No filling defect is identified in the pulmonary arterial tree to suggest pulmonary embolus. Mild aortic arch atherosclerosis and tortuosity. Mediastinum/Nodes: Unremarkable  Lungs/Pleura: Patchy airspace opacity with associated tree-in-bud nodularity posteriorly and inferiorly in the right upper lobe favoring atypical infection. Small cystic airspaces along this region could reflect focal bronchiectasis or mild cavitary component. Additional faint scattered nodularity is likely inflammatory. Upper Abdomen: Unremarkable Musculoskeletal: Unremarkable Review of the MIP images confirms the above findings. IMPRESSION: 1. No filling defect is identified in the pulmonary arterial tree to suggest pulmonary embolus. 2. Patchy airspace opacity with associated tree-in-bud nodularity posteriorly and inferiorly in the right upper lobe favoring atypical infection. Small cystic airspaces along this region could reflect focal bronchiectasis or mild cavitary component. 3. Additional faint scattered nodularity is likely inflammatory. 4.  Aortic Atherosclerosis (ICD10-I70.0). Electronically Signed   By: Freida Jes M.D.   On: 05/24/2024 09:29   CT Head Wo Contrast Result Date: 05/24/2024 CLINICAL DATA:  Provided history: Mental status change, unknown cause. History of Parkinson's disease. Tremor. EXAM: CT HEAD WITHOUT CONTRAST TECHNIQUE: Contiguous axial images were obtained from the base of the skull through the vertex without intravenous contrast. RADIATION DOSE REDUCTION: This exam was  performed according to the departmental dose-optimization program which includes automated exposure control, adjustment of the mA and/or kV according to patient size and/or use of iterative reconstruction technique. COMPARISON:  None. FINDINGS: Brain: No age-advanced or lobar predominant cerebral atrophy. There is no acute intracranial hemorrhage. No demarcated cortical infarct. No extra-axial fluid collection. No evidence of an intracranial mass. No midline shift. Vascular: No hyperdense vessel.  Atherosclerotic calcifications. Skull: No calvarial fracture or aggressive osseous lesion. Sinuses/Orbits: No mass or acute finding within the imaged orbits. No significant paranasal sinus disease at the imaged levels. IMPRESSION: No evidence of an acute intracranial abnormality. Electronically Signed   By: Bascom Lily D.O.   On: 05/24/2024 09:09   DG Chest Port 1 View Result Date: 05/24/2024 CLINICAL DATA:  Shortness of breath. EXAM: PORTABLE CHEST 1 VIEW COMPARISON:  None Available. FINDINGS: Focal patchy airspace disease is identified in the right peripheral mid lung. Subtle focal airspace opacity identified left base. No pulmonary edema or pleural effusion. Cardiopericardial silhouette is at upper limits of normal for size. No acute bony abnormality. IMPRESSION: Focal patchy airspace disease in the right peripheral mid lung and left base. Imaging features compatible with multifocal pneumonia. Close follow-up recommended to ensure complete resolution. Electronically Signed   By: Donnal Fusi M.D.   On: 05/24/2024 07:35     Procedures   Medications Ordered in the ED  sodium chloride 0.9 % bolus 500 mL (0 mLs Intravenous Stopped 05/24/24 0902)  iohexol (OMNIPAQUE) 350 MG/ML injection 75 mL (75 mLs Intravenous Contrast Given 05/24/24 0854)  cefTRIAXone (ROCEPHIN) 1 g in sodium chloride 0.9 % 100 mL IVPB (0 g Intravenous Stopped 05/24/24 1030)  doxycycline (VIBRA-TABS) tablet 100 mg (100 mg Oral Given 05/24/24  1478)                                    Medical Decision Making Amount and/or Complexity of Data Reviewed Labs: ordered. Radiology: ordered.  Risk Prescription drug management.   This patient presents to the ED for concern of tremors, this involves an extensive number of treatment options, and is a complaint that carries with it a high risk of complications and morbidity.  The differential diagnosis includes anxiety, electrolyte abn, cardiac abn, thyroid  abn, infection   Co morbidities that complicate the patient evaluation  WPW, SVT, HTN, HLD, anxiety and osteoporosis  Additional history obtained:  Additional history obtained from epic chart review  Lab Tests:  I Ordered, and personally interpreted labs.  The pertinent results include:  cbc  nl, urine nl, cmp nl, lactic nl at 1.1, tsh nl at 1.24, trop nl   Imaging Studies ordered:  I ordered imaging studies including cxr, ct head/chest  I independently visualized and interpreted imaging which showed  CXR: Focal patchy airspace disease in the right peripheral mid lung and left base. Imaging features compatible with multifocal pneumonia. Close follow-up recommended to ensure complete resolution. CT head: No evidence of an acute intracranial abnormality.  CT chest: . No filling defect is identified in the pulmonary arterial tree to  suggest pulmonary embolus.  2. Patchy airspace opacity with associated tree-in-bud nodularity  posteriorly and inferiorly in the right upper lobe favoring atypical  infection. Small cystic airspaces along this region could reflect  focal bronchiectasis or mild cavitary component.  3. Additional faint scattered nodularity is likely inflammatory.  4.  Aortic Atherosclerosis (ICD10-I70.0).      Electronically Signed   I agree with the radiologist interpretation   Cardiac Monitoring:  The patient was maintained on a cardiac monitor.  I personally viewed and interpreted the cardiac  monitored which showed an underlying rhythm of: nsr   Medicines ordered and prescription drug management:  I ordered medication including ivfs  for sx  Reevaluation of the patient after these medicines showed that the patient improved I have reviewed the patients home medicines and have made adjustments as needed   Test Considered:  ct   Critical Interventions:  abx   Problem List / ED Course:  Multifocal pna:  pt is afebrile with nl wbc count and is not hypoxic.  She is not septic.  However, the tremors she felt were likely rigors prior to onset of infection.  She is given rocephin/doxy in ED and is d/c with doxy.  She is stable for d/c.  Return if worse.  F/u with pcp.   Reevaluation:  After the interventions noted above, I reevaluated the patient and found that they have :improved   Social Determinants of Health:  Lives at home   Dispostion:  After consideration of the diagnostic results and the patients response to treatment, I feel that the patent would benefit from discharge with outpatient f/u.       Final diagnoses:  Community acquired pneumonia, unspecified laterality    ED Discharge Orders          Ordered    doxycycline (VIBRAMYCIN) 100 MG capsule  2 times daily,   Status:  Discontinued        05/24/24 0938    doxycycline (VIBRAMYCIN) 100 MG capsule  2 times daily        05/24/24 1042               Sueellen Emery, MD 05/24/24 1044

## 2024-05-24 NOTE — ED Notes (Signed)
 Patient to CT via w/c

## 2024-05-24 NOTE — ED Triage Notes (Addendum)
 Patient reports she felt like her body was vibrating at 3:30am. Paitnet states she took an 81mg  aspirin and then had tremors when having bowel movement. Patient reports that she recently has been under stress recently with losing her husband and unsure if this is related to anxiety. Denies chest pain or shortness  of breath.

## 2024-05-25 ENCOUNTER — Encounter: Payer: Self-pay | Admitting: Nurse Practitioner

## 2024-05-29 ENCOUNTER — Inpatient Hospital Stay: Admitting: Family Medicine

## 2024-05-29 LAB — CULTURE, BLOOD (ROUTINE X 2)
Culture: NO GROWTH
Culture: NO GROWTH
Special Requests: ADEQUATE
Special Requests: ADEQUATE

## 2024-05-30 ENCOUNTER — Encounter: Payer: Self-pay | Admitting: Nurse Practitioner

## 2024-05-30 ENCOUNTER — Other Ambulatory Visit: Payer: Self-pay | Admitting: *Deleted

## 2024-05-30 ENCOUNTER — Ambulatory Visit: Admitting: Nurse Practitioner

## 2024-05-30 VITALS — BP 130/82 | HR 78 | Wt 108.4 lb

## 2024-05-30 DIAGNOSIS — J189 Pneumonia, unspecified organism: Secondary | ICD-10-CM | POA: Insufficient documentation

## 2024-05-30 DIAGNOSIS — M810 Age-related osteoporosis without current pathological fracture: Secondary | ICD-10-CM

## 2024-05-30 DIAGNOSIS — F5104 Psychophysiologic insomnia: Secondary | ICD-10-CM | POA: Insufficient documentation

## 2024-05-30 MED ORDER — TRAZODONE HCL 50 MG PO TABS: 25.0000 mg | ORAL_TABLET | Freq: Every evening | ORAL | 3 refills | Status: DC | PRN

## 2024-05-30 MED ORDER — PREDNISONE 10 MG PO TABS: 10.0000 mg | ORAL_TABLET | Freq: Every day | ORAL | 0 refills | Status: AC

## 2024-05-30 MED ORDER — ALENDRONATE SODIUM 70 MG PO TABS: 70.0000 mg | ORAL_TABLET | ORAL | 3 refills | Status: AC

## 2024-05-30 MED ORDER — DOXYCYCLINE HYCLATE 100 MG PO TABS: 100.0000 mg | ORAL_TABLET | Freq: Two times a day (BID) | ORAL | 0 refills | Status: DC

## 2024-05-30 MED ORDER — PRAVASTATIN SODIUM 40 MG PO TABS: 40.0000 mg | ORAL_TABLET | Freq: Every evening | ORAL | 3 refills | Status: DC

## 2024-05-30 NOTE — Patient Instructions (Signed)
 Community-Acquired Pneumonia, Adult Pneumonia is a lung infection that causes inflammation and the buildup of mucus and fluids in the lungs. This may cause coughing and difficulty breathing. Community-acquired pneumonia is pneumonia that develops in people who are not, and have not recently been, in a hospital or other health care facility. Usually, pneumonia develops as a result of an illness that is caused by a virus, such as the common cold and the flu (influenza). It can also be caused by bacteria or fungi. While the common cold and influenza can pass from person to person (are contagious), pneumonia itself is not considered contagious. What are the causes? This condition may be caused by: Viruses. Bacteria. Fungi. What increases the risk? The following factors may make you more likely to develop this condition: Being over age 40 or having certain medical conditions, such as: A long-term (chronic) disease, such as: chronic obstructive pulmonary disease (COPD), asthma, heart failure, diabetes, or kidney disease. A condition that increases the risk of breathing in (aspirating) mucus and other fluids from your mouth and nose. A weakened body defense system (immune system). Having had your spleen removed (splenectomy). The spleen is the organ that helps fight germs and infections. Not cleaning your teeth and gums well (poor dental hygiene). Using tobacco products. Traveling to places where germs that cause pneumonia are present or being near certain animals or animal habitats that could have germs that cause pneumonia. What are the signs or symptoms? Symptoms of this condition include: A dry cough or a wet (productive) cough. A fever, sweating, or chills. Chest pain, especially when breathing deeply or coughing. Fast breathing, difficulty breathing, or shortness of breath. Tiredness (fatigue) and muscle aches. How is this diagnosed? This condition may be diagnosed based on your medical  history or a physical exam. You may also have tests, including: Imaging, such as a chest X-ray or lung ultrasound. Tests of: The level of oxygen and other gases in your blood. Mucus from your lungs (sputum). Fluid around your lungs (pleural fluid). Your urine. How is this treated? Treatment for this condition depends on many factors, such as the cause of your pneumonia, your medicines, and other medical conditions that you have. For most adults, pneumonia may be treated at home. In some cases, treatment must happen in a hospital and may include: Medicines that are given by mouth (orally) or through an IV, including: Antibiotic medicines, if bacteria caused the pneumonia. Medicines that kill viruses (antiviral medicines), if a virus caused the pneumonia. Oxygen therapy. Severe pneumonia, although rare, may require the following treatments: Mechanical ventilation.This procedure uses a machine to help you breathe if you cannot breathe well on your own or maintain a safe level of blood oxygen. Thoracentesis. This procedure removes any buildup of pleural fluid to help with breathing. Follow these instructions at home:  Medicines Take over-the-counter and prescription medicines only as told by your health care provider. Take cough medicine only if you have trouble sleeping. Cough medicine can prevent your body from removing mucus from your lungs. If you were prescribed antibiotics, take them as told by your health care provider. Do not stop taking the antibiotic even if you start to feel better. Lifestyle     Do not drink alcohol. Do not use any products that contain nicotine or tobacco. These products include cigarettes, chewing tobacco, and vaping devices, such as e-cigarettes. If you need help quitting, ask your health care provider. Eat a healthy diet. This includes plenty of vegetables, fruits, whole grains, low-fat  dairy products, and lean protein. General instructions Rest a lot and  get at least 8 hours of sleep each night. Sleep in a partly upright position at night. Place a few pillows under your head or sleep in a reclining chair. Return to your normal activities as told by your health care provider. Ask your health care provider what activities are safe for you. Drink enough fluid to keep your urine pale yellow. This helps to thin the mucus in your lungs. If your throat is sore, gargle with a mixture of salt and water 3-4 times a day or as needed. To make salt water, completely dissolve -1 tsp (3-6 g) of salt in 1 cup (237 mL) of warm water. Keep all follow-up visits. How is this prevented? You can lower your risk of developing community-acquired pneumonia by: Getting the pneumonia vaccine. There are different types and schedules of pneumonia vaccines. Ask your health care provider which option is best for you. Consider getting the pneumonia vaccine if: You are older than 73 years of age. You are 67-15 years of age and are receiving cancer treatment, have chronic lung disease, or have other medical conditions that affect your immune system. Ask your health care provider if this applies to you. Getting your influenza vaccine every year. Ask your health care provider which type of vaccine is best for you. Getting regular dental checkups. Washing your hands often with soap and water for at least 20 seconds. If soap and water are not available, use hand sanitizer. Contact a health care provider if: You have a fever. You have trouble sleeping because you cannot control your cough with cough medicine. Get help right away if: Your shortness of breath becomes worse. Your chest pain increases. Your sickness becomes worse, especially if you are an older adult or have a weak immune system. You cough up blood. These symptoms may be an emergency. Get help right away. Call 911. Do not wait to see if the symptoms will go away. Do not drive yourself to the  hospital. Summary Pneumonia is an infection of the lungs. Community-acquired pneumonia develops in people who have not been in the hospital. It can be caused by bacteria, viruses, or fungi. This condition may be treated with antibiotics or antiviral medicines. Severe pneumonia may require a hospital stay and treatment to help with breathing. This information is not intended to replace advice given to you by your health care provider. Make sure you discuss any questions you have with your health care provider. Document Revised: 11/01/2023 Document Reviewed: 01/26/2022 Elsevier Patient Education  2025 [employer].

## 2024-05-30 NOTE — Assessment & Plan Note (Signed)
 Osteoporosis managed with Fosamax , prescription due for renewal. - Renew Fosamax  prescription for one year.

## 2024-05-30 NOTE — Progress Notes (Signed)
 Annella Kief, DNP, AGNP-c [facility] [address] [place], [place] [address] [phone]   ACUTE VISIT- ESTABLISHED PATIENT  Blood pressure 130/82, pulse 78, weight 108 lb 6.4 oz (49.2 kg), SpO2 97%.  Subjective:  HPI History of Present Illness Amber Nunez is a 73 year old female who presents with shortness of breath and recent pneumonia diagnosis.  She was diagnosed with pneumonia on Friday, but did not begin experiencing significant shortness of breath until Sunday. Her shortness of breath was particularly severe on Sunday and again on Tuesday morning when she returned to work, but it has since improved.   She was prescribed doxycycline 100 mg twice daily, which she started on Friday night after receiving a dose in the hospital. She has two more pills left. No fever or chills, but she experiences 'warm flashes' at night. Her appetite was initially poor, but she has been consuming nutritional drinks to maintain her intake due to lactose intolerance, which limits her options for nutritional supplements. She endorses feeling better but still experiencing cough and milder dyspnea.    She takes Aleve for symptom relief and has been using Xanax  in quarter doses for anxiety.  No fever or chills, but she experiences 'warm flashes' at night. Her appetite was initially poor, but she has been consuming nutritional drinks to maintain her intake due to lactose intolerance, which limits her options for nutritional supplements.  She is dealing with significant grief following the recent sudden death of her husband, impacting her emotional well-being and sleep. She reports waking up at the time her husband passed away and has been using Tylenol PM to aid sleep, though she tries to avoid using it every night. She is planning to move back to her hometown closer to family and friends after the Fourth of July to help with her grief. She still expresses concern over the situation  that led to her husband's passing and questions the root cause.  ROS negative except for what is listed in HPI. History, Medications, Surgery, SDOH, and Family History reviewed and updated as appropriate.  Objective:  Physical Exam Vitals and nursing note reviewed.  Constitutional:      General: She is not in acute distress.    Appearance: She is normal weight. She is ill-appearing.  HENT:     Head: Normocephalic.   Eyes:     Conjunctiva/sclera: Conjunctivae normal.   Neck:     Vascular: No carotid bruit.   Cardiovascular:     Rate and Rhythm: Normal rate and regular rhythm.     Pulses: Normal pulses.     Heart sounds: Normal heart sounds.  Pulmonary:     Effort: Pulmonary effort is normal.     Breath sounds: Examination of the left-upper field reveals rhonchi. Examination of the left-middle field reveals decreased breath sounds. Decreased breath sounds and rhonchi present.  Chest:     Chest wall: Tenderness present.  Lymphadenopathy:     Cervical: Cervical adenopathy present.   Skin:    General: Skin is warm and dry.     Capillary Refill: Capillary refill takes less than 2 seconds.   Neurological:     Mental Status: She is alert and oriented to person, place, and time.   Psychiatric:        Mood and Affect: Mood normal.        Behavior: Behavior normal.         Assessment & Plan:   Problem List Items Addressed This Visit  Osteoporosis   Osteoporosis managed with Fosamax , prescription due for renewal. - Renew Fosamax  prescription for one year.       Relevant Medications   alendronate  (FOSAMAX ) 70 MG tablet   Atypical pneumonia - Primary   Recent diagnosis with symptoms of shortness of breath and cough, predominantly affecting the right lung with some left lung involvement. Persistent inflammation despite doxycycline treatment suggests possible postinfectious bronchitis. Today there is significant rhonchi in the upper left lung indicating ongoing infection.  The right lung is clear.  - Complete current doxycycline course. - Prescribe additional 5-day doxycycline course. - Prescribe low-dose prednisone  to reduce inflammation. - Recommend Mucinex for productive cough. - If shortness of breath persists by Monday, consider inhaler for postinfectious bronchitis. - May need to get CXR if symptoms persist.        Relevant Medications   doxycycline (VIBRA-TABS) 100 MG tablet   predniSONE  (DELTASONE ) 10 MG tablet   Psychophysiological insomnia   Anxiety and grief following the recent loss of her husband, with symptoms of difficulty sleeping and emotional distress. Currently using alprazolam  (Xanax ) in quarter doses as needed. Discussed trazodone for sleep support, highlighting its non-habit-forming nature and typical lack of unusual side effects. Safe use of Tylenol PM for sleep was also discussed. - Continue alprazolam  as needed. - Prescribe trazodone for sleep support.       Relevant Medications   traZODone (DESYREL) 50 MG tablet      Annella Kief, DNP, AGNP-c

## 2024-05-30 NOTE — Assessment & Plan Note (Signed)
 Anxiety and grief following the recent loss of her husband, with symptoms of difficulty sleeping and emotional distress. Currently using alprazolam  (Xanax ) in quarter doses as needed. Discussed trazodone for sleep support, highlighting its non-habit-forming nature and typical lack of unusual side effects. Safe use of Tylenol PM for sleep was also discussed. - Continue alprazolam  as needed. - Prescribe trazodone for sleep support.

## 2024-05-30 NOTE — Telephone Encounter (Signed)
 Patient profile removed from [facility] as patient declined to proceed with home sleep test and returned device to the office.

## 2024-05-30 NOTE — Assessment & Plan Note (Signed)
 Recent diagnosis with symptoms of shortness of breath and cough, predominantly affecting the right lung with some left lung involvement. Persistent inflammation despite doxycycline treatment suggests possible postinfectious bronchitis. Today there is significant rhonchi in the upper left lung indicating ongoing infection. The right lung is clear.  - Complete current doxycycline course. - Prescribe additional 5-day doxycycline course. - Prescribe low-dose prednisone  to reduce inflammation. - Recommend Mucinex for productive cough. - If shortness of breath persists by Monday, consider inhaler for postinfectious bronchitis. - May need to get CXR if symptoms persist.

## 2024-06-05 ENCOUNTER — Other Ambulatory Visit: Payer: Self-pay | Admitting: Nurse Practitioner

## 2024-06-05 ENCOUNTER — Ambulatory Visit: Payer: Self-pay

## 2024-06-05 DIAGNOSIS — J189 Pneumonia, unspecified organism: Secondary | ICD-10-CM

## 2024-06-05 NOTE — Telephone Encounter (Signed)
 Recommend repeat x-ray to monitor for changes in pneumonia. If symptoms become severe, please direct her to go to the ED.   Stat CXR ordered for [address]. Please call to let her know she can walk in to have this done.

## 2024-06-05 NOTE — Telephone Encounter (Signed)
 FYI Only or Action Required?: Action required by provider: request for appointment and update on patient condition.  Patient was last seen in primary care on 05/30/2024 by Early, Camie BRAVO, NP. Called Nurse Triage reporting Shortness of Breath. Symptoms began several weeks ago. Interventions attempted: OTC medications: Advil and Prescription medications: doxycycline  and prednisone . Symptoms are: gradually worsening.  Triage Disposition: See PCP When Office is Open (Within 3 Days)  Patient/caregiver understands and will follow disposition?: Yes                             Copied from CRM [id number]. Topic: Clinical - Red Word Triage >> Jun 05, 2024 10:48 AM Myrick T wrote: Red Word that prompted transfer to Nurse Triage: patient said she was on a medication for pneumonia, she took her last dosage on yesterday but is still having shortness of breath. Reason for Disposition  [1] Completed antibiotic treatment AND [2] breathing not back to normal  Answer Assessment - Initial Assessment Questions 1. SYMPTOM: What's the main symptom you're concerned about? (e.g., breathing difficulty, fever, weakness)     Shortness of breath 2. ONSET: When did the symptoms start?     Started again this morning 3. BETTER-SAME-WORSE: Are you getting better, staying the same, or getting worse compared to the day you were discharged?     States difficulty breathing got better and returned this morning again 4. BREATHING DIFFICULTY: Are you having any difficulty breathing? If Yes, ask: How bad is it?  (e.g., none, mild, moderate, severe)   - MILD: No SOB at rest, mild SOB with walking, speaks normally in sentences, can lie down, no retractions, pulse < 100.    - MODERATE: SOB at rest, SOB with minimal exertion and prefers to sit, cannot lie down flat, speaks in phrases, mild retractions, audible wheezing, pulse 100-120.    - SEVERE: Very SOB at rest, speaks in single words, struggling to  breathe, sitting hunched forward, retractions, pulse > 120      Mild- SOB upon exertion, patient able to speak in clear and complete sentences while on phone with this RN 5. FEVER: Do you have a fever? If Yes, ask: What is your temperature, how was it measured, and when did it start?     Denies 6. SPUTUM: Describe the color of your sputum (clear, white, yellow, green, blood-tinged)     Denies 7. DIAGNOSIS CONFIRMATION: When was the pneumonia diagnosed? By whom?     05/24/24 in ER 8. ANTIBIOTIC: Are you taking an antibiotic?  If Yes, ask: Which one? When was it started?     Doxycycline , completed last night  9. OTHER TREATMENT: Are you receiving any other treatment for the pneumonia? (e.g., albuterol nebulizer, oxygen) If Yes, ask: How often? and Does it help?     Denies 11. O2 SATURATION MONITOR:  Do you use an oxygen saturation monitor (pulse oximeter) at home? If Yes, What is your reading (oxygen level) today? What is your usual oxygen saturation reading? (e.g., 95%)     98 this morning     No available appointments until end of next week. Please advise patient on scheduling.  Protocols used: Pneumonia Follow-up Call-A-[facility]

## 2024-06-05 NOTE — Addendum Note (Signed)
 Addended by: Letisha Yera, CAMIE E on: 06/05/2024 03:42 PM   Modules accepted: Orders

## 2024-06-06 ENCOUNTER — Ambulatory Visit: Payer: Self-pay | Admitting: Nurse Practitioner

## 2024-06-06 ENCOUNTER — Ambulatory Visit (HOSPITAL_BASED_OUTPATIENT_CLINIC_OR_DEPARTMENT_OTHER)
Admission: RE | Admit: 2024-06-06 | Discharge: 2024-06-06 | Disposition: A | Discharge status: Home / Self Care | Source: Ambulatory Visit | Attending: Nurse Practitioner | Admitting: Nurse Practitioner

## 2024-06-06 DIAGNOSIS — J189 Pneumonia, unspecified organism: Secondary | ICD-10-CM | POA: Insufficient documentation

## 2024-06-06 DIAGNOSIS — R0602 Shortness of breath: Secondary | ICD-10-CM | POA: Diagnosis not present

## 2024-06-06 DIAGNOSIS — R918 Other nonspecific abnormal finding of lung field: Secondary | ICD-10-CM | POA: Diagnosis not present

## 2024-06-06 MED ORDER — DOXYCYCLINE HYCLATE 100 MG PO TABS: 100.0000 mg | ORAL_TABLET | Freq: Two times a day (BID) | ORAL | 0 refills | Status: DC

## 2024-06-07 MED ORDER — DOXYCYCLINE HYCLATE 100 MG PO TABS: 100.0000 mg | ORAL_TABLET | Freq: Two times a day (BID) | ORAL | 0 refills | Status: AC

## 2024-07-08 ENCOUNTER — Encounter: Payer: Self-pay | Admitting: Cardiology

## 2024-07-08 MED ORDER — EZETIMIBE 10 MG PO TABS: 10.0000 mg | ORAL_TABLET | Freq: Every day | ORAL | 3 refills | Status: DC

## 2024-08-20 ENCOUNTER — Telehealth: Payer: Self-pay | Admitting: Cardiology

## 2024-08-20 MED ORDER — EZETIMIBE 10 MG PO TABS: 10.0000 mg | ORAL_TABLET | Freq: Every day | ORAL | 1 refills | Status: AC

## 2024-08-20 NOTE — Telephone Encounter (Signed)
*  STAT* If patient is at the pharmacy, call can be transferred to refill team.   1. Which medications need to be refilled? (please list name of each medication and dose if known) ezetimibe  (ZETIA ) 10 MG tablet   2. Which pharmacy/location (including street and city if local pharmacy) is medication to be sent to? [facility] [address], [place] - [address]   3. Do they need a 30 day or 90 day supply? 90

## 2024-08-20 NOTE — Telephone Encounter (Signed)
 RX sent in

## 2024-09-12 ENCOUNTER — Other Ambulatory Visit: Payer: Self-pay | Admitting: Nurse Practitioner

## 2024-09-12 DIAGNOSIS — F5104 Psychophysiologic insomnia: Secondary | ICD-10-CM

## 2024-09-12 NOTE — Telephone Encounter (Signed)
 Last appt. 05/30/24.

## 2024-11-11 ENCOUNTER — Other Ambulatory Visit: Payer: Self-pay | Admitting: Cardiology
